# Patient Record
Sex: Female | Born: 1975
Health system: Southern US, Community
[De-identification: ages and names within clinical notes are randomized; demographics above are authoritative.]

## PROBLEM LIST (undated history)

## (undated) DIAGNOSIS — R87629 Unspecified abnormal cytological findings in specimens from vagina: Secondary | ICD-10-CM

## (undated) HISTORY — PX: TUBAL LIGATION: SHX77

## (undated) HISTORY — DX: Unspecified abnormal cytological findings in specimens from vagina: R87.629

---

## 2001-03-07 ENCOUNTER — Other Ambulatory Visit: Admission: RE | Admit: 2001-03-07 | Discharge: 2001-03-07 | Payer: Self-pay | Admitting: Dermatology

## 2001-11-19 ENCOUNTER — Other Ambulatory Visit: Admission: RE | Admit: 2001-11-19 | Discharge: 2001-11-19 | Payer: Self-pay | Admitting: Obstetrics and Gynecology

## 2002-06-09 ENCOUNTER — Inpatient Hospital Stay (HOSPITAL_COMMUNITY): Admission: RE | Admit: 2002-06-09 | Discharge: 2002-06-11 | Payer: Self-pay | Admitting: Obstetrics and Gynecology

## 2009-03-21 ENCOUNTER — Emergency Department (HOSPITAL_COMMUNITY): Admission: EM | Admit: 2009-03-21 | Discharge: 2009-03-21 | Payer: Self-pay | Admitting: Emergency Medicine

## 2011-02-17 NOTE — Op Note (Signed)
NAME:  Carla Sims, Carla Sims                         ACCOUNT NO.:  [id number]   MEDICAL RECORD NO.:  [id number]                   PATIENT TYPE:  AMB   LOCATION:  DAY                                  FACILITY:  [facility]   PHYSICIAN:  Tilda Burrow, M.D.              DATE OF BIRTH:  Jun 05, 1976   DATE OF PROCEDURE:  06/09/2002  DATE OF DISCHARGE:                                 OPERATIVE REPORT   PREOPERATIVE DIAGNOSES:  1. Pregnancy, 39 weeks.  2. Repeat cesarean section after trial of labor.  3. Elective sterilization.   POSTOPERATIVE DIAGNOSES:  1. Pregnancy, 39 weeks.  2. Repeat cesarean section after trial of labor.  3. Elective sterilization.   PROCEDURE:  Repeat low transverse cervical cesarean section.   SURGEON:  Tilda Burrow, M.D.   ASSISTANTMarlinda Mike, RN.   ANESTHESIA:  Spinal, Wahler, CRNA.   COMPLICATIONS:  None.   FINDINGS:  Healthy female infant, Apgars 8/9, weight _____________.   INDICATIONS FOR PROCEDURE:  A 35 year old female gravida 2, para 1, with  prior cesarean section for cephalopelvic disproportion, attempt at vaginal  delivery.  She was scheduled for June 09, 2002, for cesarean section and  began to contract and be mildly uncomfortable on the day of the scheduled  surgery, June 09, 2002.   DESCRIPTION OF PROCEDURE:  The patient was taken to the operating room,  prepped and draped for lower abdominal surgery.  A Pfannenstiel-type  incision performed with excision of old cicatrix.  The peritoneal cavity was  entered through the midline in standard Pfannenstiel technique and bladder  flap developed on the very thin lower uterine segment which was opened  transversely, extended laterally using index finger traction.  The membranes  ruptured with Allis clamp and then revealing clear amniotic fluid, then the  fetal vertex delivered easily using fundal pressure.  The infant was  delivered, nuchal cord x1 noted, and the cord clamped, and the infant  passed  to waiting physician, Debbora Dus, D.O.  Placental blood samples were  obtained and then placenta delivered intact, Tomasa Blase presentation, with  minimal blood loss.  The uterus was closed using single layer running,  locking closure of 0 chromic.  There was bleeding from the right uterine  artery with placement of the second suture.  This was treated by encircling  the right uterine vessels both above and below the incision, which was  ultimately good hemostasis.  Collateral circulation was excellent and result  was excellent.   PROCEDURE #2:  Tubal ligation.  The tubal ligation was then performed, removing a mid segment knuckle of  each tube after double ligation around the base of the incarcerated knuckle  of tube.  Specimens were identified with a stitch in the right tube and sent  for histology.   Bladder flap was inspected, firmed as hemostatic, 2-0 chromic continuous  running closure of the bladder flap performed followed by  irrigation of the  abdomen, sponge counts, closure of the peritoneum with 2-0 chromic followed  by 0 Vicryl closure of the fascia.  The subcutaneous tissues were inspected,  irrigated, confirmed as hemostatic, and closed with interrupted 2-0 plain  suture x3 sutures then staple closure of the skin completed the procedure.  Estimated blood loss 350 cc.                                                Tilda Burrow, M.D.    JVF/MEDQ  D:  06/09/2002  T:  06/09/2002  Job:  [id number]

## 2011-02-17 NOTE — Discharge Summary (Signed)
   NAME:  Carla Sims, Carla Sims                         ACCOUNT NO.:  [id number]   MEDICAL RECORD NO.:  [id number]                   PATIENT TYPE:  INP   LOCATION:  LDR5                                 FACILITY:  [facility]   PHYSICIAN:  Tilda Burrow, M.D.              DATE OF BIRTH:  1976-06-18   DATE OF ADMISSION:  06/09/2002  DATE OF DISCHARGE:  06/11/2002                                 DISCHARGE SUMMARY   ADMITTING DIAGNOSIS:  Pregnancy [redacted] weeks gestation, repeat cesarean section,  not for trial of labor, desire for elective permanent sterilization.   DISCHARGE DIAGNOSIS:  Pregnancy [redacted] weeks gestation, repeat cesarean section,  not for trial of labor, desire for elective permanent sterilization,  delivered.   PROCEDURE:  Repeat cesarean section, bilateral partial salpingectomy  performed on June 09, 2002.   DISCHARGE MEDICATIONS:  Tylox 1-2 q.4h. p.r.n. pain.   FOLLOW UP:  Follow up in one week for staple removal.   HOSPITAL SUMMARY:  This 35 year old Hispanic female gravida 1, para 0 was  admitted for repeat cesarean and tubal ligation, as described in the  admitting history.   PAST MEDICAL HISTORY:  Benign.   PAST SURGICAL HISTORY:  Cesarean section in 1999, 7 pound 10 ounce infant,  large for the patient's body habitus.   ALLERGIES:  None known.   PHYSICAL EXAMINATION:  VITAL SIGNS:  Height 4 feet 11 inches, weight 122  pounds.  PELVIC:  Fundal height 35 cm.   HOSPITAL COURSE:  The patient underwent repeat cesarean section on June 09, 2002 at 7:30.  The patient was contracting actively with the cesarean  section, having begun contracting approximately 4 a.m.  Postoperatively, the  patient had a low-grade temperature to 100.6 on postoperative day #1 with  hemoglobin 9, hematocrit 29 on postoperative day #1.  The second day, the  patient had a strong desire to go home, though the abdomen was  somewhat soft, slightly full, and mildly gaseous distention present.   The  patient was counseled over signs and symptoms of infection or complication.  Proceeded to go home and did well.  Followup was in one week at our office  for reevaluation.                                                 Tilda Burrow, M.D.    JVF/MEDQ  D:  07/20/2002  T:  07/21/2002  Job:  [id number]

## 2011-02-17 NOTE — H&P (Signed)
Carla Sims, VIEN NO.:  [id number]   MEDICAL RECORD NO.:  [id number]                  PATIENT TYPE:   LOCATION:                                       FACILITY:   PHYSICIAN:  Tilda Burrow, M.D.              DATE OF BIRTH:   DATE OF ADMISSION:  06/09/2002  DATE OF DISCHARGE:                                HISTORY & PHYSICAL   ADMISSION DIAGNOSES:  1. Pregnancy at 39 weeks' gestation.  2. Repeat cesarean section.  3. Not for trial of labor.  4. Desire for elective permanent sterilization.   HISTORY OF PRESENT ILLNESS:  This 35 year old Hispanic female, gravida 2,  para 1, AB 0, last menstrual period questionably 11/20, placing menstrual  EDC 05/28/02, with ultrasound-assigned EDC of 06/15/02 based on both seven-  week ultrasound and confirmed with a 20-week ultrasound suggesting EDC of  06/10/02, is admitted at 39+ weeks' gestation for repeat cesarean section and  tubal ligation.  The patient has confirmed her desire for permanent  sterilization and has signed consents in our office.  She understands that  the failure rate is 1 in 100.  I have been very careful in my explanations  today to ensure that her comprehension of permanent sterilization is clear  and complete, and she acknowledges request for permanent sterilization.  She  is therefore scheduled not only for cesarean section but tubal ligation on  06/09/02.   PAST MEDICAL HISTORY:  Negative.   PAST SURGICAL HISTORY:  Cesarean section in 1999 for a 7 pound 10 ounce  infant, large for her body habitus.   ALLERGIES:  None known.   SOCIAL HISTORY:  Married, husband, one child.  Works at [employer]  Habits:  Negative for cigarettes, alcohol, or recreational drugs.   PHYSICAL EXAMINATION:  GENERAL:  A healthy-appearing Hispanic female, alert  and oriented x3.  An alert, oriented, communicative Hispanic female who  speaks good [place].  VITAL SIGNS:  Height 4 feet 11 inches, weight  122, blood pressure 120/70,  pulse 80s.  CARDIOVASCULAR:  Unremarkable.  ABDOMEN:  35 cm fundal height, estimated fetal weight 6 pounds.  PELVIC:  Cervix not checked at this time.  It was normal at last check.    PRENATAL LABORATORY DATA:  Blood type O positive.  Urine drug screen  negative.  Hemoglobin 12, hematocrit 36.  Hepatitis, HIV, GC, Chlamydia, RPR  all negative.  Glucose tolerance test 49 mg%.   PLAN:  Repeat cesarean section with tubal ligation on 06/09/02.                                                Tilda Burrow, M.D.    JVF/MEDQ  D:  06/04/2002  T:  06/04/2002  Job:  [id number]  cc:   Francoise Schaumann. Halm, D.O.

## 2011-05-15 ENCOUNTER — Other Ambulatory Visit: Payer: Self-pay | Admitting: Family Medicine

## 2011-05-18 ENCOUNTER — Ambulatory Visit (HOSPITAL_COMMUNITY)
Admission: RE | Admit: 2011-05-18 | Discharge: 2011-05-18 | Disposition: A | Discharge status: Home / Self Care | Payer: 59 | Source: Ambulatory Visit | Attending: Family Medicine | Admitting: Family Medicine

## 2011-05-18 DIAGNOSIS — R109 Unspecified abdominal pain: Secondary | ICD-10-CM | POA: Insufficient documentation

## 2011-05-18 DIAGNOSIS — K8020 Calculus of gallbladder without cholecystitis without obstruction: Secondary | ICD-10-CM | POA: Insufficient documentation

## 2011-05-23 NOTE — H&P (Signed)
  NTS SOAP Note  Vital Signs:  Vitals as of: 05/23/2011: Systolic 104: Diastolic 67: Heart Rate 76: Temp 97.48F: Height 68ft 7.5in: Weight 109Lbs 0 Ounces: Pain Level 0: BMI 25  BMI : 24.88 kg/m2  Subjective: This 8 Years 29 Months old Female presents for of ABDOMINAL PAIN : ,Has been having right upper quadrant abdominal pain over the past few weeks. She has had nausea and vomiting. She does have fatty food intolerance. No fever, chills, or jaundice have been noted.Dr. Lubertha South, her primary doctor, did an ultrasound of the gallbladder which revealed a significant cholelithiasis with a common bile duct at 6 cm. No choledocholithiasis was seen.  Review of Symptoms:  Constitutional: unremarkable  Head: unremarkable  Eyes:unremarkable  Nose/Mouth/Throat:unremarkable  Cardiovascular:unremarkable  Respiratory: unremarkable  Gastrointestin abdominal pain,nausea,vomiting Genitourinary: unremarkable  Musculoskeletal: unremarkable  Skin: unremarkable  Hematolgic/Lymphatic: unremarkable  Allergic/Immunologic:unremarkable     Past Medical History:Reviewed  Past Medical History  Surgical History: c-sections, BTL Medical Problems: none Allergies: nkda Medications: protonix   Social History: Reviewed   Social History  Preferred Language: English ([place]) Race: American [place] or [place] Native Ethnicity: Not Hispanic / Latino Age: 55 Years 7 Months Marital Status: M Alcohol: No Recreational drug(s): No   Smoking Status: Never smoker reviewed on 05/23/2011  Family History: Reviewed  Family History  Is there a family history [id number]   Objective Information:  General: Well appearing, well nourished in no distress.  Skin:no rash or prominent lesions  Head: Atraumatic; no masses; no abnormalities  Eyes: conjunctiva clear, EOM intact, PERRL  Neck: Supple without lymphadenopathy.  Heart: RRR, no murmur or gallop. Normal S1, S2. No S3, S4.  Lungs:CTA  bilaterally, no wheezes, rhonchi, rales. Breathing unlabored.  Abdomen: Soft,ND, no HSM, no masses. Tender in right upper quadrant to palpation. No rigidity noted.  Extremities: unremarkable   LFT noted to be elevated, Direct bili .4  Assessment: cholecystitis, cholelithiasis, ?choledocholithiasis  Diagnosis & Procedure: DiagnosisCode: 574.00, ProcedureCode: 19147,   Orders: preop orders sent    Plan:Scheduled for laparoscopic cholecystectomy with cholangiograms on 05/26/11.     Patient Education: Alternative treatments to surgery were discussed with patient (and family).Risks and benefits of procedure were fully explained to the patient (and family) who gave informed consent. Patient/family questions were addressed.  Follow-up: Pending Surgery

## 2011-05-24 ENCOUNTER — Encounter (HOSPITAL_COMMUNITY)
Admission: RE | Admit: 2011-05-24 | Discharge: 2011-05-24 | Disposition: A | Discharge status: Home / Self Care | Payer: 59 | Source: Ambulatory Visit | Attending: General Surgery | Admitting: General Surgery

## 2011-05-24 ENCOUNTER — Encounter (HOSPITAL_COMMUNITY): Payer: Self-pay

## 2011-05-24 LAB — HEPATIC FUNCTION PANEL
AST: 46 U/L — ABNORMAL HIGH (ref 0–37)
Bilirubin, Direct: 0.1 mg/dL (ref 0.0–0.3)
Indirect Bilirubin: 0.3 mg/dL (ref 0.3–0.9)
Total Bilirubin: 0.4 mg/dL (ref 0.3–1.2)

## 2011-05-24 LAB — BASIC METABOLIC PANEL
BUN: 11 mg/dL (ref 6–23)
CO2: 27 mEq/L (ref 19–32)
Chloride: 101 mEq/L (ref 96–112)
Creatinine, Ser: 0.51 mg/dL (ref 0.50–1.10)
GFR calc Af Amer: >60 mL/min (ref >60)
Glucose, Bld: 88 mg/dL (ref 70–99)
Potassium: 4.1 mEq/L (ref 3.5–5.1)

## 2011-05-24 LAB — DIFFERENTIAL
Basophils Absolute: 0 10*3/uL (ref 0.0–0.1)
Basophils Relative: 0 % (ref 0–1)
Eosinophils Absolute: 0.1 10*3/uL (ref 0.0–0.7)
Eosinophils Relative: 2 % (ref 0–5)
Monocytes Absolute: 0.6 10*3/uL (ref 0.1–1.0)
Monocytes Relative: 10 % (ref 3–12)
Neutro Abs: 2.7 10*3/uL (ref 1.7–7.7)

## 2011-05-24 LAB — CBC
HCT: 36.6 % (ref 36.0–46.0)
Hemoglobin: 12.4 g/dL (ref 12.0–15.0)
MCH: 28.1 pg (ref 26.0–34.0)
MCHC: 33.9 g/dL (ref 30.0–36.0)
MCV: 82.8 fL (ref 78.0–100.0)
RDW: 13.3 % (ref 11.5–15.5)

## 2011-05-24 LAB — SURGICAL PCR SCREEN: Staphylococcus aureus: NEGATIVE

## 2011-05-24 MED ORDER — CEFAZOLIN SODIUM 1-5 GM-% IV SOLN: 1.0000 g | INTRAVENOUS | Status: DC

## 2011-05-24 MED ORDER — ENOXAPARIN SODIUM 40 MG/0.4ML ~~LOC~~ SOLN: 40.0000 mg | Freq: Once | SUBCUTANEOUS | Status: DC

## 2011-05-24 NOTE — Patient Instructions (Addendum)
General Anesthetic, Adult A nurse specialized in giving anesthesia (anesthetist) or a doctor specialized in giving anesthesia (anesthesiologist) gave you a medicine that made you sleep while a procedure was performed. For as long as 24 hours following this procedure, you may feel:  Dizzy.   Weak.   Drowsy.  BEFORE YOUR ANESTHETIC OR SURGERY  You should be present one before your procedure or as directed by your caregiver. Check in at the admissions desk to fill out any necessary forms if you are not pre-registered. There may be consent forms or other paperwork to sign before the procedure.   There is a waiting area for your family while you are having your procedure.  LET YOUR CAREGIVERS KNOW ABOUT THE FOLLOWING  Allergies.  Medications taken including herbs, eye drops, over the counter medications, dietary supplements, and creams.   Use of steroids (by mouth or creams).   Previous problems with anesthetics or novocaine.   Use of cigarettes, alcohol, or illicit drugs.   Possibility of pregnancy, if this applies.  History of blood clots (DVTs, pulmonary embolism).   History of bleeding or blood problems.   Previous surgery.   Family history (especially anesthetic problems).   Other health problems.  1.   1.  AFTER YOUR SURGERY 1. After surgery, you will be taken to the recovery area where a nurse will monitor your progress. You will be allowed to go home when you are awake, stable, taking fluids well, and without complications.  2.  3. For the first 24 hours following an anesthetic:   Have a responsible person with you.   Do not drive a car. If you are alone, do not take public transportation.   Do not drink alcohol.   Do not take medicine that has not been prescribed by your caregiver.   Do not sign important papers or make important decisions.   You may resume normal diet and activities as directed.   Change bandages (dressings) as directed.   Only take  over-the-counter or prescription medicines for pain, discomfort, and or fever, as directed by your caregiver.  If you have questions or problems that seem related to the anesthetic, call the hospital and ask for the anesthetist or anesthesiologist on call. FINDING OUT THE RESULTS OF YOUR TEST Not all test results are available during your visit. If your test results are not back during the visit, make an appointment with your caregiver to find out the results. Do not assume everything is normal if you have not heard from your caregiver or the medical facility. It is important for you to follow up on all of your test results. SEEK IMMEDIATE MEDICAL CARE IF:  You develop a rash.   You have difficulty breathing.   You have chest pain.   You have allergic problems.  Document Released: 12/26/2007 Document Re-Released: 12/13/2009 [facility] Patient Information 2011 [place], [place] Carla Sims  05/24/2011   Your procedure is scheduled on:  05/26/2011  Report to [facility] at 09:30 AM.  Call this number if you have problems the morning of surgery: [phone]   Remember:   Do not eat food:After Midnight.  Do not drink clear liquids: After Midnight.  Take these medicines the morning of surgery with A SIP OF WATER: protonix with a sip water   Do not wear jewelry, make-up or nail polish.  Do not wear lotions, powders, or perfumes. You may wear deodorant.  Do not shave 48 hours prior to surgery.  Do not bring  valuables to the hospital.  Contacts, dentures or bridgework may not be worn into surgery.  Leave suitcase in the car. After surgery it may be brought to your room.  For patients admitted to the hospital, checkout time is 11:00 AM the day of discharge.   Patients discharged the day of surgery will not be allowed to drive home.  Name and phone number of your driver: husband  Special Instructions: CHG Shower Shower 2 days before surgery and 1 day before surgery with Hibiclens.   Please  read over the following fact sheets that you were given: Pain Booklet

## 2011-05-26 ENCOUNTER — Ambulatory Visit (HOSPITAL_COMMUNITY)
Admission: RE | Admit: 2011-05-26 | Discharge: 2011-05-26 | Disposition: A | Discharge status: Home / Self Care | Payer: 59 | Source: Ambulatory Visit | Attending: General Surgery | Admitting: General Surgery

## 2011-05-26 ENCOUNTER — Other Ambulatory Visit: Payer: Self-pay | Admitting: General Surgery

## 2011-05-26 ENCOUNTER — Encounter (HOSPITAL_COMMUNITY)
Admission: RE | Disposition: A | Discharge status: Home / Self Care | Payer: Self-pay | Source: Ambulatory Visit | Attending: General Surgery

## 2011-05-26 ENCOUNTER — Encounter (HOSPITAL_COMMUNITY): Payer: Self-pay | Admitting: Anesthesiology

## 2011-05-26 ENCOUNTER — Encounter (HOSPITAL_COMMUNITY): Payer: Self-pay | Admitting: *Deleted

## 2011-05-26 ENCOUNTER — Ambulatory Visit (HOSPITAL_COMMUNITY): Payer: 59 | Admitting: Anesthesiology

## 2011-05-26 ENCOUNTER — Ambulatory Visit (HOSPITAL_COMMUNITY): Payer: 59

## 2011-05-26 DIAGNOSIS — Z01818 Encounter for other preprocedural examination: Secondary | ICD-10-CM | POA: Insufficient documentation

## 2011-05-26 DIAGNOSIS — Z01812 Encounter for preprocedural laboratory examination: Secondary | ICD-10-CM | POA: Insufficient documentation

## 2011-05-26 DIAGNOSIS — K8010 Calculus of gallbladder with chronic cholecystitis without obstruction: Secondary | ICD-10-CM | POA: Insufficient documentation

## 2011-05-26 HISTORY — PX: CHOLECYSTECTOMY: SHX55

## 2011-05-26 SURGERY — LAPAROSCOPIC CHOLECYSTECTOMY
Anesthesia: General | Wound class: Contaminated

## 2011-05-26 MED ORDER — GLYCOPYRROLATE 0.2 MG/ML IJ SOLN
INTRAMUSCULAR | Status: AC
  Filled 2011-05-26: qty 1

## 2011-05-26 MED ORDER — LACTATED RINGERS IV SOLN
INTRAVENOUS | Status: DC
  Administered 2011-05-26: 12:00:00 via INTRAVENOUS

## 2011-05-26 MED ORDER — SUCCINYLCHOLINE CHLORIDE 20 MG/ML IJ SOLN
INTRAMUSCULAR | Status: AC
  Filled 2011-05-26: qty 1

## 2011-05-26 MED ORDER — FENTANYL CITRATE 0.05 MG/ML IJ SOLN
INTRAMUSCULAR | Status: AC
  Filled 2011-05-26: qty 2

## 2011-05-26 MED ORDER — ACETAMINOPHEN 325 MG PO TABS: 325.0000 mg | ORAL_TABLET | ORAL | Status: DC | PRN

## 2011-05-26 MED ORDER — LIDOCAINE HCL 1 % IJ SOLN
INTRAMUSCULAR | Status: DC | PRN
  Administered 2011-05-26: 10 mg via INTRADERMAL

## 2011-05-26 MED ORDER — ENOXAPARIN SODIUM 40 MG/0.4ML ~~LOC~~ SOLN
SUBCUTANEOUS | Status: AC
  Filled 2011-05-26: qty 0.4

## 2011-05-26 MED ORDER — ONDANSETRON HCL 4 MG/2ML IJ SOLN: 4.0000 mg | Freq: Once | INTRAMUSCULAR | Status: DC | PRN

## 2011-05-26 MED ORDER — FENTANYL CITRATE 0.05 MG/ML IJ SOLN
INTRAMUSCULAR | Status: DC | PRN
  Administered 2011-05-26 (×4): 50 ug via INTRAVENOUS

## 2011-05-26 MED ORDER — FENTANYL CITRATE 0.05 MG/ML IJ SOLN
INTRAMUSCULAR | Status: AC
  Filled 2011-05-26: qty 5

## 2011-05-26 MED ORDER — SODIUM CHLORIDE 0.9 % IR SOLN
Status: DC | PRN
  Administered 2011-05-26: 1000 mL

## 2011-05-26 MED ORDER — GLYCOPYRROLATE 0.2 MG/ML IJ SOLN
0.2000 mg | Freq: Once | INTRAMUSCULAR | Status: AC | PRN
  Administered 2011-05-26: 0.2 mg via INTRAVENOUS

## 2011-05-26 MED ORDER — KETOROLAC TROMETHAMINE 30 MG/ML IJ SOLN
INTRAMUSCULAR | Status: AC
  Administered 2011-05-26: 30 mg via INTRAVENOUS
  Filled 2011-05-26: qty 1

## 2011-05-26 MED ORDER — FENTANYL CITRATE 0.05 MG/ML IJ SOLN
25.0000 ug | INTRAMUSCULAR | Status: DC | PRN
  Administered 2011-05-26: 25 ug via INTRAVENOUS

## 2011-05-26 MED ORDER — CEFAZOLIN SODIUM 1-5 GM-% IV SOLN
1.0000 g | Freq: Once | INTRAVENOUS | Status: AC
  Administered 2011-05-26: 1 g via INTRAVENOUS

## 2011-05-26 MED ORDER — NEOSTIGMINE METHYLSULFATE 1 MG/ML IJ SOLN
INTRAMUSCULAR | Status: DC | PRN
  Administered 2011-05-26: 2 mg via INTRAMUSCULAR

## 2011-05-26 MED ORDER — KETOROLAC TROMETHAMINE 30 MG/ML IJ SOLN
30.0000 mg | Freq: Once | INTRAMUSCULAR | Status: AC
  Administered 2011-05-26: 30 mg via INTRAVENOUS

## 2011-05-26 MED ORDER — MIDAZOLAM HCL 2 MG/2ML IJ SOLN
1.0000 mg | INTRAMUSCULAR | Status: DC | PRN
  Administered 2011-05-26: 2 mg via INTRAVENOUS

## 2011-05-26 MED ORDER — ROCURONIUM BROMIDE 100 MG/10ML IV SOLN
INTRAVENOUS | Status: DC | PRN
  Administered 2011-05-26: 5 mg via INTRAVENOUS
  Administered 2011-05-26: 15 mg via INTRAVENOUS
  Administered 2011-05-26: 5 mg via INTRAVENOUS

## 2011-05-26 MED ORDER — MIDAZOLAM HCL 2 MG/2ML IJ SOLN
INTRAMUSCULAR | Status: AC
  Filled 2011-05-26: qty 2

## 2011-05-26 MED ORDER — PROPOFOL 10 MG/ML IV EMUL
INTRAVENOUS | Status: DC | PRN
  Administered 2011-05-26: 130 mg via INTRAVENOUS
  Administered 2011-05-26: 30 mg via INTRAVENOUS

## 2011-05-26 MED ORDER — HEMOSTATIC AGENTS (NO CHARGE) OPTIME
TOPICAL | Status: DC | PRN
  Administered 2011-05-26: 2 via TOPICAL

## 2011-05-26 MED ORDER — ONDANSETRON HCL 4 MG/2ML IJ SOLN
4.0000 mg | Freq: Once | INTRAMUSCULAR | Status: AC
  Administered 2011-05-26: 4 mg via INTRAVENOUS

## 2011-05-26 MED ORDER — FENTANYL CITRATE 0.05 MG/ML IJ SOLN
INTRAMUSCULAR | Status: AC
  Administered 2011-05-26: 25 ug via INTRAVENOUS
  Filled 2011-05-26: qty 5

## 2011-05-26 MED ORDER — OXYCODONE-ACETAMINOPHEN 7.5-325 MG PO TABS: ORAL_TABLET | ORAL | Status: DC

## 2011-05-26 MED ORDER — SUCCINYLCHOLINE CHLORIDE 20 MG/ML IJ SOLN
INTRAMUSCULAR | Status: DC | PRN
  Administered 2011-05-26: 100 mg via INTRAVENOUS

## 2011-05-26 MED ORDER — PROPOFOL 10 MG/ML IV EMUL
INTRAVENOUS | Status: AC
  Filled 2011-05-26: qty 20

## 2011-05-26 MED ORDER — BUPIVACAINE HCL (PF) 0.5 % IJ SOLN
INTRAMUSCULAR | Status: AC
  Filled 2011-05-26: qty 30

## 2011-05-26 MED ORDER — LIDOCAINE HCL (PF) 1 % IJ SOLN
INTRAMUSCULAR | Status: AC
  Filled 2011-05-26: qty 5

## 2011-05-26 MED ORDER — ROCURONIUM BROMIDE 50 MG/5ML IV SOLN
INTRAVENOUS | Status: AC
  Filled 2011-05-26: qty 1

## 2011-05-26 MED ORDER — BUPIVACAINE HCL (PF) 0.5 % IJ SOLN
INTRAMUSCULAR | Status: DC | PRN
  Administered 2011-05-26: 10 mL

## 2011-05-26 MED ORDER — CEFAZOLIN SODIUM 1-5 GM-% IV SOLN
INTRAVENOUS | Status: AC
  Filled 2011-05-26: qty 50

## 2011-05-26 MED ORDER — GLYCOPYRROLATE 0.2 MG/ML IJ SOLN
INTRAMUSCULAR | Status: DC | PRN
  Administered 2011-05-26: .4 mg via INTRAVENOUS

## 2011-05-26 MED ORDER — ONDANSETRON HCL 4 MG/2ML IJ SOLN
INTRAMUSCULAR | Status: AC
  Filled 2011-05-26: qty 2

## 2011-05-26 MED ORDER — IOHEXOL 350 MG/ML SOLN
INTRAVENOUS | Status: DC | PRN
  Administered 2011-05-26: 50 mL via INTRAVENOUS

## 2011-05-26 MED ORDER — ENOXAPARIN SODIUM 40 MG/0.4ML ~~LOC~~ SOLN
40.0000 mg | Freq: Once | SUBCUTANEOUS | Status: AC
  Administered 2011-05-26: 40 mg via SUBCUTANEOUS

## 2011-05-26 SURGICAL SUPPLY — 44 items
APPLIER CLIP ROT 10 11.4 M/L (STAPLE) ×2
APR CLP MED LRG 11.4X10 (STAPLE) ×1
BAG HAMPER (MISCELLANEOUS) ×2 IMPLANT
BAG SPEC RTRVL LRG 6X4 10 (ENDOMECHANICALS) ×1
CATH CHOLANGIOGRAM 4.5FR (CATHETERS) ×2 IMPLANT
CLIP APPLIE ROT 10 11.4 M/L (STAPLE) ×1 IMPLANT
CLOTH BEACON ORANGE TIMEOUT ST (SAFETY) ×2 IMPLANT
COVER LIGHT HANDLE STERIS (MISCELLANEOUS) ×4 IMPLANT
COVER MAYO STAND XLG (DRAPE) ×2 IMPLANT
DECANTER SPIKE VIAL GLASS SM (MISCELLANEOUS) ×2 IMPLANT
DISSECTOR BLUNT TIP ENDO 5MM (MISCELLANEOUS) IMPLANT
DRAPE C-ARM FOLDED MOBILE STRL (DRAPES) ×2 IMPLANT
DURAPREP 26ML APPLICATOR (WOUND CARE) ×2 IMPLANT
ELECT REM PT RETURN 9FT ADLT (ELECTROSURGICAL) ×2
ELECTRODE REM PT RTRN 9FT ADLT (ELECTROSURGICAL) ×1 IMPLANT
FILTER SMOKE EVAC LAPAROSHD (FILTER) ×2 IMPLANT
FORMALIN 10 PREFIL 120ML (MISCELLANEOUS) ×2 IMPLANT
GLOVE BIO SURGEON STRL SZ7.5 (GLOVE) ×2 IMPLANT
GLOVE ECLIPSE 6.5 STRL STRAW (GLOVE) ×1 IMPLANT
GLOVE ECLIPSE 7.0 STRL STRAW (GLOVE) ×1 IMPLANT
GLOVE EXAM NITRILE MD LF STRL (GLOVE) ×1 IMPLANT
GLOVE INDICATOR 7.0 STRL GRN (GLOVE) ×2 IMPLANT
GLOVE INDICATOR 7.5 STRL GRN (GLOVE) ×1 IMPLANT
GOWN BRE IMP SLV AUR XL STRL (GOWN DISPOSABLE) ×6 IMPLANT
HEMOSTAT SNOW SURGICEL 2X4 (HEMOSTASIS) ×3 IMPLANT
INST SET LAPROSCOPIC AP (KITS) ×2 IMPLANT
KIT ROOM TURNOVER APOR (KITS) ×2 IMPLANT
KIT TROCAR LAP CHOLE (TROCAR) ×2 IMPLANT
MANIFOLD NEPTUNE II (INSTRUMENTS) ×2 IMPLANT
NS IRRIG 1000ML POUR BTL (IV SOLUTION) ×2 IMPLANT
PACK LAP CHOLE LZT030E (CUSTOM PROCEDURE TRAY) ×2 IMPLANT
PAD ARMBOARD 7.5X6 YLW CONV (MISCELLANEOUS) ×2 IMPLANT
POUCH SPECIMEN RETRIEVAL 10MM (ENDOMECHANICALS) ×2 IMPLANT
SET BASIN LINEN APH (SET/KITS/TRAYS/PACK) ×2 IMPLANT
SET TUBE IRRIG SUCTION NO TIP (IRRIGATION / IRRIGATOR) IMPLANT
SPONGE GAUZE 2X2 8PLY STRL LF (GAUZE/BANDAGES/DRESSINGS) ×5 IMPLANT
STAPLER VISISTAT (STAPLE) ×2 IMPLANT
SUT VICRYL 0 UR6 27IN ABS (SUTURE) ×3 IMPLANT
SYR 20CC LL (SYRINGE) ×2 IMPLANT
SYR 30ML LL (SYRINGE) ×2 IMPLANT
TAPE CLOTH SURG 4X10 WHT LF (GAUZE/BANDAGES/DRESSINGS) ×1 IMPLANT
TOWEL OR 17X26 4PK STRL BLUE (TOWEL DISPOSABLE) ×2 IMPLANT
WARMER LAPAROSCOPE (MISCELLANEOUS) ×2 IMPLANT
YANKAUER SUCT 12FT TUBE ARGYLE (SUCTIONS) ×2 IMPLANT

## 2011-05-26 NOTE — Interval H&P Note (Signed)
History and Physical Interval Note:   05/26/2011   11:19 AM   Carla Sims  has presented today for surgery, with the diagnosis of Cholelithiasis [574.20]  The various methods of treatment have been discussed with the patient and family. After consideration of risks, benefits and other options for treatment, the patient has consented to  Procedure(s): LAPAROSCOPIC CHOLECYSTECTOMY WITH INTRAOPERATIVE CHOLANGIOGRAM as a surgical intervention .  I have reviewed the patients' chart and labs.  Questions were answered to the patient's satisfaction.     Dalia Heading  MD

## 2011-05-26 NOTE — Op Note (Signed)
Patient:  Carla Sims  DOB:  19-Jun-1976  MRN:  [id number]   Preop Diagnosis:  Cholecystitis, cholelithiasis  Postop Diagnosis:  Same  Procedure:  Laparoscopic cholecystectomy  Surgeon:  Franky Macho, M.D.  Anes:  General endotracheal  Indications:  Patient is a 35 year old Hispanic female who presents with acute cholecystitis secondary to cholelithiasis. The patient now comes for laparoscopic cholecystectomy with cholangiograms. Of note was the fact that her liver enzyme tests have normalized since they were checked in the emergency room prior to her visit to my office. The risks and benefits of the procedure including bleeding, infection, hepatobiliary injury, the possibly of an open procedure were fully explained to the patient, gave informed consent.  Procedure note:  Patient was placed in the supine position. After induction of general endotracheal anesthesia, the abdomen was prepped and draped using usual sterile technique with DuraPrep. Surgical site confirmation was performed.  An infraumbilical incision was made down to the fascia. A varies needle was introduced into the abdominal cavity and confirmation of placement was done using the saline drop test. The abdomen was then insufflated to 16 mm mercury pressure. An 11 mm trocar was introduced into the abdominal cavity under direct visualization the difficulty. The patient's placed in reverse Trendelenburg position additional 11 mm trocar was placed the epigastric region 5 mm trochars were placed the right upper quadrant and right flank regions. The liver was inspected and noted within normal limits. The gallbladder was noted to be full of stones and inflamed to a moderate degree at the infundibulum. The cystic duct was first identified. Its junction to the infundibulum was fully identified. The cystic duct was narrow and did not contain any stones. Given its size, I elected not to proceed with cholangiograms. Endoclips were placed  proximally distally on the cystic duct and cystic duct was divided. This likewise done the cystic artery. The gallbladder was then freed away from the gallbladder fossa using Bovie electrocautery. The gallbladder delivered to the epigastric trocar site. The gallbladder fossa was inspected no abnormal bleeding or bile leakage was noted. Surgicel is placed the gallbladder fossa. All fluid and air were then evacuated from the abdominal cavity prior to removal of the trochars.  All wounds were normal saline. All wounds were injected with 0.5% Sensorcaine. The infraumbilical fashion as well as epigastric fascia reapproximated using 0 Vicryl interrupted sutures. All skin incisions were closed using staples. Betadine ointment after dressings were applied.  All tape and needle counts were correct at the end of the procedure. The patient was extubated in the operating room and went back to recovery awake in stable condition.   Complications:  None  EBL:  Minimal  Specimen:  Gallbladder

## 2011-05-26 NOTE — Anesthesia Procedure Notes (Signed)
Procedure Name: Intubation Date/Time: 05/26/2011 12:28 PM Performed by: Minerva Areola Pre-anesthesia Checklist: Patient identified, Patient being monitored, Timeout performed, Emergency Drugs available and Suction available Patient Re-evaluated:Patient Re-evaluated prior to inductionOxygen Delivery Method: Circle System Utilized Preoxygenation: Pre-oxygenation with 100% oxygen Intubation Type: Rapid sequence and Circoid Pressure applied Laryngoscope Size: Miller and 2 Grade View: Grade I Tube type: Oral Tube size: 7.0 mm Number of attempts: 1 Airway Equipment and Method: stylet Placement Confirmation: ETT inserted through vocal cords under direct vision,  positive ETCO2 and breath sounds checked- equal and bilateral Secured at: 18 cm Tube secured with: Tape Dental Injury: Teeth and Oropharynx as per pre-operative assessment

## 2011-05-26 NOTE — Anesthesia Preprocedure Evaluation (Addendum)
Anesthesia Evaluation  Name, MR# and DOB Patient awake  General Assessment Comment  Reviewed: Allergy & Precautions, H&P , NPO status , Patient's Chart, lab work & pertinent test results  Airway Mallampati: I TM Distance: >3 FB Neck ROM: Full    Dental No notable dental hx.    Pulmonary    pulmonary exam normalPulmonary Exam Normal     Cardiovascular Regular Normal    Neuro/Psych Negative Neurological ROS  Negative Psych ROS  GI/Hepatic/Renal   negative Liver ROS  negative Renal ROS   GERD Medicated     Endo/Other  Negative Endocrine ROS (+)      Abdominal Normal abdominal exam  (+)   Musculoskeletal negative musculoskeletal ROS (+)   Hematology negative hematology ROS (+)   Peds  Reproductive/Obstetrics negative OB ROS    Anesthesia Other Findings            Anesthesia Physical Anesthesia Plan  ASA: I  Anesthesia Plan: General   Post-op Pain Management:    Induction: Intravenous, Rapid sequence and Cricoid pressure planned  Airway Management Planned: Oral ETT  Additional Equipment:   Intra-op Plan:   Post-operative Plan: Extubation in OR  Informed Consent: I have reviewed the patients History and Physical, chart, labs and discussed the procedure including the risks, benefits and alternatives for the proposed anesthesia with the patient or authorized representative who has indicated his/her understanding and acceptance.     Plan Discussed with: CRNA  Anesthesia Plan Comments:         Anesthesia Quick Evaluation

## 2011-05-26 NOTE — Transfer of Care (Signed)
Immediate Anesthesia Transfer of Care Note  Patient: Carla Sims  Procedure(s) Performed:  LAPAROSCOPIC CHOLECYSTECTOMY  Patient Location: PACU  Anesthesia Type: General  Level of Consciousness: awake  Airway & Oxygen Therapy: Patient Spontanous Breathing and non-rebreather face mask  Post-op Assessment: Report given to PACU RN, Post -op Vital signs reviewed and stable and Patient moving all extremities  Post vital signs: Reviewed and stable  Complications: No apparent anesthesia complications

## 2011-05-30 NOTE — Anesthesia Postprocedure Evaluation (Signed)
Anesthesia Post Note  Patient: Carla Sims  Procedure(s) Performed:  LAPAROSCOPIC CHOLECYSTECTOMY  Anesthesia type: General  Patient location: PACU  Post pain: Pain level controlled  Post assessment: Post-op Vital signs reviewed, Patient's Cardiovascular Status Stable, Respiratory Function Stable, Patent Airway and Pain level controlled  Last Vitals:  Filed Vitals:   05/26/11 1434  BP: 127/66  Pulse: 62  Temp: 97.9 F (36.6 C)  Resp: 18    Post vital signs: Reviewed and stable  Level of consciousness: awake  Complications: No apparent anesthesia complications Late entry by T.Doris Gruhn CRNA Pt.stable 05/26/11

## 2011-06-01 ENCOUNTER — Encounter (HOSPITAL_COMMUNITY): Payer: Self-pay | Admitting: General Surgery

## 2013-10-01 ENCOUNTER — Ambulatory Visit (INDEPENDENT_AMBULATORY_CARE_PROVIDER_SITE_OTHER): Payer: 59 | Admitting: Nurse Practitioner

## 2013-10-01 ENCOUNTER — Encounter: Payer: Self-pay | Admitting: Nurse Practitioner

## 2013-10-01 VITALS — BP 110/80 | Temp 98.4°F | Ht 66.0 in | Wt 104.1 lb

## 2013-10-01 DIAGNOSIS — J209 Acute bronchitis, unspecified: Secondary | ICD-10-CM

## 2013-10-01 DIAGNOSIS — J069 Acute upper respiratory infection, unspecified: Secondary | ICD-10-CM

## 2013-10-01 MED ORDER — AMOXICILLIN-POT CLAVULANATE 875-125 MG PO TABS: 1.0000 | ORAL_TABLET | Freq: Two times a day (BID) | ORAL | Status: DC

## 2013-10-01 MED ORDER — HYDROCODONE-HOMATROPINE 5-1.5 MG/5ML PO SYRP: 5.0000 mL | ORAL_SOLUTION | ORAL | Status: DC | PRN

## 2013-10-03 ENCOUNTER — Encounter: Payer: Self-pay | Admitting: Nurse Practitioner

## 2013-10-03 NOTE — Progress Notes (Signed)
Subjective:  Presents with complaints of cough and congestion for the past 5-6 days. Low-grade fever. Frequent cough worse at night. No wheezing. No headache. Scratchy throat. Occasional ear pain. Taking fluids well. Voiding normal limit. No vomiting diarrhea or abdominal pain.  Objective:   BP 110/80  Temp(Src) 98.4 F (36.9 C) (Oral)  Ht 5\' 6"  (1.676 m)  Wt 104 lb 2 oz (47.231 kg)  BMI 16.81 kg/m2 NAD. Alert, oriented. TMs clear effusion, no erythema. Pharynx injected with PND noted. Neck supple with mild soft nontender adenopathy. Lungs scattered faint expiratory crackles, no wheezing or tachypnea. Frequent bronchitic cough noted. Heart regular rate rhythm.  Assessment:Acute upper respiratory infections of unspecified site  Acute bronchitis  Plan: Meds ordered this encounter  Medications  . amoxicillin-clavulanate (AUGMENTIN) 875-125 MG per tablet    Sig: Take 1 tablet by mouth 2 (two) times daily.    Dispense:  20 tablet    Refill:  0    Order Specific Question:  Supervising Provider    Answer:  Merlyn AlbertLUKING, WILLIAM S [[id number]]  . HYDROcodone-homatropine (HYCODAN) 5-1.5 MG/5ML syrup    Sig: Take 5 mLs by mouth every 4 (four) hours as needed.    Dispense:  120 mL    Refill:  0    Order Specific Question:  Supervising Provider    Answer:  Merlyn AlbertLUKING, WILLIAM S [[id number]]   OTC meds as directed for daytime symptoms. Warning signs reviewed. Call back in 5-7 days if no improvement, sooner if worse.

## 2014-04-30 ENCOUNTER — Encounter: Payer: 59 | Admitting: Nurse Practitioner

## 2014-08-01 ENCOUNTER — Encounter: Payer: Self-pay | Admitting: *Deleted

## 2014-08-21 ENCOUNTER — Encounter: Payer: Self-pay | Admitting: Nurse Practitioner

## 2014-08-21 ENCOUNTER — Ambulatory Visit (INDEPENDENT_AMBULATORY_CARE_PROVIDER_SITE_OTHER): Payer: BC Managed Care – PPO | Admitting: Nurse Practitioner

## 2014-08-21 VITALS — BP 112/70 | Ht <= 58 in | Wt 107.4 lb

## 2014-08-21 DIAGNOSIS — Z0000 Encounter for general adult medical examination without abnormal findings: Secondary | ICD-10-CM

## 2014-08-21 DIAGNOSIS — Z1231 Encounter for screening mammogram for malignant neoplasm of breast: Secondary | ICD-10-CM

## 2014-08-21 DIAGNOSIS — Z124 Encounter for screening for malignant neoplasm of cervix: Secondary | ICD-10-CM

## 2014-08-24 LAB — PAP IG W/ RFLX HPV ASCU

## 2014-08-26 ENCOUNTER — Encounter: Payer: Self-pay | Admitting: Nurse Practitioner

## 2014-08-26 NOTE — Progress Notes (Signed)
   Subjective:    Patient ID: Carla Sims, female    DOB: 07/02/1976, 38 y.o.   MRN: [id number]  HPI presents for her wellness exam. Active job. Healthy diet. Regular menses, heavy first day; 3 days total. Married, same sexual partner. Regular vision and dental exams. No family history of breast cancer. Labs are done at work; normal according to patient; results unavailable during office visit.    Review of Systems  Constitutional: Negative for fever, activity change, appetite change and fatigue.  HENT: Negative for dental problem, ear pain, sinus pressure and sore throat.   Respiratory: Negative for cough, chest tightness, shortness of breath and wheezing.   Cardiovascular: Negative for chest pain.  Gastrointestinal: Negative for nausea, vomiting, abdominal pain, diarrhea, constipation and abdominal distention.  Genitourinary: Negative for dysuria, urgency, frequency, vaginal discharge, enuresis, difficulty urinating, genital sores, menstrual problem and pelvic pain.       Objective:   Physical Exam  Constitutional: She is oriented to person, place, and time. She appears well-developed. No distress.  HENT:  Right Ear: External ear normal.  Left Ear: External ear normal.  Mouth/Throat: Oropharynx is clear and moist.  Neck: Normal range of motion. Neck supple. No tracheal deviation present. No thyromegaly present.  Cardiovascular: Normal rate, regular rhythm and normal heart sounds.  Exam reveals no gallop.   No murmur heard. Pulmonary/Chest: Effort normal and breath sounds normal.  Abdominal: Soft. She exhibits no distension. There is no tenderness.  Genitourinary: Vagina normal and uterus normal. No vaginal discharge found.  External GU: no rashes or lesions; vagina: no discharge; cervix nl in appearance; no CMT; bimanual exam: no tenderness or obvious masses  Musculoskeletal: She exhibits no edema.  Lymphadenopathy:    She has no cervical adenopathy.  Neurological: She is alert  and oriented to person, place, and time.  Skin: Skin is warm and dry. No rash noted.  Psychiatric: She has a normal mood and affect. Her behavior is normal.  Vitals reviewed. Breast exam: minimal fine nodularity; no dominant masses; axillae no adenopathy        Assessment & Plan:  Routine general medical examination at a health care facility  Screening for cervical cancer - Plan: Pap IG w/ reflex to HPV when ASC-U  Visit for screening mammogram - Plan: MM DIGITAL SCREENING BILATERAL  Recommend regular exercise and daily vitamin D/calcium supplementation. Also advised patient to bring labs to next visit. Return in about 1 year (around 08/22/2015).

## 2014-08-31 ENCOUNTER — Ambulatory Visit (HOSPITAL_COMMUNITY)
Admission: RE | Admit: 2014-08-31 | Discharge: 2014-08-31 | Disposition: A | Discharge status: Home / Self Care | Payer: BC Managed Care – PPO | Source: Ambulatory Visit | Attending: Nurse Practitioner | Admitting: Nurse Practitioner

## 2014-08-31 DIAGNOSIS — Z1231 Encounter for screening mammogram for malignant neoplasm of breast: Secondary | ICD-10-CM | POA: Diagnosis present

## 2015-08-30 ENCOUNTER — Encounter: Payer: Self-pay | Admitting: Nurse Practitioner

## 2016-01-06 ENCOUNTER — Ambulatory Visit (INDEPENDENT_AMBULATORY_CARE_PROVIDER_SITE_OTHER): Payer: BLUE CROSS/BLUE SHIELD | Admitting: Nurse Practitioner

## 2016-01-06 ENCOUNTER — Encounter: Payer: Self-pay | Admitting: Nurse Practitioner

## 2016-01-06 VITALS — BP 100/68 | Ht <= 58 in | Wt 107.0 lb

## 2016-01-06 DIAGNOSIS — B3731 Acute candidiasis of vulva and vagina: Secondary | ICD-10-CM

## 2016-01-06 DIAGNOSIS — Z0000 Encounter for general adult medical examination without abnormal findings: Secondary | ICD-10-CM | POA: Diagnosis not present

## 2016-01-06 DIAGNOSIS — Z01419 Encounter for gynecological examination (general) (routine) without abnormal findings: Secondary | ICD-10-CM

## 2016-01-06 DIAGNOSIS — Z1231 Encounter for screening mammogram for malignant neoplasm of breast: Secondary | ICD-10-CM

## 2016-01-06 DIAGNOSIS — B373 Candidiasis of vulva and vagina: Secondary | ICD-10-CM | POA: Diagnosis not present

## 2016-01-06 MED ORDER — TERCONAZOLE 0.4 % VA CREA: 1.0000 | TOPICAL_CREAM | Freq: Every day | VAGINAL | Status: DC

## 2016-01-06 MED ORDER — HYDROCORTISONE 2.5 % EX CREA: TOPICAL_CREAM | CUTANEOUS | Status: DC

## 2016-01-06 NOTE — Progress Notes (Signed)
   Subjective:    Patient ID: Carla Sims, female    DOB: 12/15/1975, 40 y.o.   MRN: [id number]  HPI presents for her wellness exam. Has routine lab work done through her job, states all of them have been normal. No copy available during visit. Married, same sexual partner. Regular menstrual cycle, normal flow. Regular vision and dental exams. Has had some slight itching especially after intercourse. No fever pelvic pain or discharge. Temporary relief with Monistat.    Review of Systems  Constitutional: Negative for activity change, appetite change and fatigue.  HENT: Negative for dental problem, ear pain, sinus pressure and sore throat.   Respiratory: Negative for cough, chest tightness, shortness of breath and wheezing.   Cardiovascular: Negative for chest pain.  Gastrointestinal: Negative for nausea, vomiting, abdominal pain, diarrhea, constipation and abdominal distention.  Genitourinary: Negative for dysuria, urgency, frequency, vaginal discharge, enuresis, difficulty urinating, genital sores, menstrual problem and pelvic pain.       Objective:   Physical Exam  Constitutional: She is oriented to person, place, and time. She appears well-developed. No distress.  HENT:  Right Ear: External ear normal.  Left Ear: External ear normal.  Mouth/Throat: Oropharynx is clear and moist.  Neck: Normal range of motion. Neck supple. No tracheal deviation present. No thyromegaly present.  Cardiovascular: Normal rate, regular rhythm and normal heart sounds.  Exam reveals no gallop.   No murmur heard. Pulmonary/Chest: Effort normal and breath sounds normal.  Abdominal: Soft. She exhibits no distension. There is no tenderness.  Genitourinary: Vagina normal and uterus normal. No vaginal discharge found.  External GU: no rashes or lesions. Vagina: No erythema, small amount of thick white discharge noted. No CMT. Cervix normal limit in appearance. Bimanual exam no tenderness or obvious masses.    Musculoskeletal: She exhibits no edema.  Lymphadenopathy:    She has no cervical adenopathy.  Neurological: She is alert and oriented to person, place, and time.  Skin: Skin is warm and dry. No rash noted.  Psychiatric: She has a normal mood and affect. Her behavior is normal.  Vitals reviewed. Breast exam: Very dense tissue with multiple fine nodularity, no dominant masses. Axilla no adenopathy.       Assessment & Plan:  Well woman exam  Vagina, candidiasis  Encounter for screening mammogram for breast cancer - Plan: MM DIGITAL SCREENING BILATERAL  Meds ordered this encounter  Medications  . terconazole (TERAZOL 7) 0.4 % vaginal cream    Sig: Place 1 applicator vaginally at bedtime. x7d    Dispense:  45 g    Refill:  0    Order Specific Question:  Supervising Provider    Answer:  Carla Sims, WILLIAM S [[id number]]  . hydrocortisone 2.5 % cream    Sig: Apply a small amount to facial rash BID prn up to 2 weeks at a time    Dispense:  30 g    Refill:  0    Order Specific Question:  Supervising Provider    Answer:  Carla Sims, WILLIAM S [[id number]]   Acidophilus as directed for yeast prevention. Call back if symptoms persist. Also patient mentions a faint rash around the lower part of her face that occurs off and on. Has been using her husband's Elocon cream. Switch to low strength hydrocortisone 2.5%. Call back if persists. Encouraged healthy diet with vitamin D and calcium. Return in about 1 year (around 01/05/2017) for physical.

## 2016-01-06 NOTE — Patient Instructions (Signed)
Take Acidophilous as directed to prevent yeast infections (this is a natural supplement that you can buy at the store)

## 2016-01-17 ENCOUNTER — Ambulatory Visit (HOSPITAL_COMMUNITY)
Admission: RE | Admit: 2016-01-17 | Discharge: 2016-01-17 | Disposition: A | Discharge status: Home / Self Care | Payer: BLUE CROSS/BLUE SHIELD | Source: Ambulatory Visit | Attending: Nurse Practitioner | Admitting: Nurse Practitioner

## 2016-01-17 ENCOUNTER — Other Ambulatory Visit: Payer: Self-pay | Admitting: Nurse Practitioner

## 2016-01-17 DIAGNOSIS — Z1231 Encounter for screening mammogram for malignant neoplasm of breast: Secondary | ICD-10-CM

## 2016-02-21 ENCOUNTER — Ambulatory Visit (INDEPENDENT_AMBULATORY_CARE_PROVIDER_SITE_OTHER): Payer: BLUE CROSS/BLUE SHIELD | Admitting: Family Medicine

## 2016-02-21 ENCOUNTER — Encounter: Payer: Self-pay | Admitting: Family Medicine

## 2016-02-21 VITALS — BP 106/72 | Temp 98.9°F | Ht <= 58 in | Wt 101.5 lb

## 2016-02-21 DIAGNOSIS — A084 Viral intestinal infection, unspecified: Secondary | ICD-10-CM

## 2016-02-21 MED ORDER — ONDANSETRON 4 MG PO TBDP: 4.0000 mg | ORAL_TABLET | Freq: Three times a day (TID) | ORAL | Status: DC | PRN

## 2016-02-21 MED ORDER — DIPHENOXYLATE-ATROPINE 2.5-0.025 MG PO TABS: ORAL_TABLET | ORAL | Status: DC

## 2016-02-21 NOTE — Patient Instructions (Signed)

## 2016-02-21 NOTE — Progress Notes (Signed)
   Subjective:    Patient ID: Carla Sims, female    DOB: 11/04/1975, 40 y.o.   MRN: [id number]  Abdominal Pain This is a new problem. The current episode started in the past 7 days. The pain is located in the generalized abdominal region. Associated symptoms include diarrhea, headaches, myalgias and vomiting. Treatments tried: Pepto, Advil.  last Monday had epigastric abd pain , fairly severe, vom also,  Feeling nauseted some diarrhea    peptobismol helped a little   No fever  Patient has concern of lower back pain when standing.  Patient also experiencing substantial achiness in her legs and joints.  patient had a relative with fairly severe viral gastroenteritis a week before she got sick  Review of Systems  Gastrointestinal: Positive for vomiting, abdominal pain and diarrhea.  Musculoskeletal: Positive for myalgias.  Neurological: Positive for headaches.       Objective:   Physical Exam   alert vitals stable moderate malaise hydration decent lungs clear heart rare rhythm abdomen hyperactive bowel sounds no discrete tenderness      Assessment & Plan:   impression viral gastritis with accompanying fatigue and myalgias/arthralgias. Discussed. Patient feels completely unable to work this week plan work excuse. Lomotil when necessary for diarrhea Zofran. For nausea warning signs discussed WSL

## 2016-02-22 DIAGNOSIS — Z029 Encounter for administrative examinations, unspecified: Secondary | ICD-10-CM

## 2016-02-29 ENCOUNTER — Encounter: Payer: Self-pay | Admitting: Family Medicine

## 2017-06-20 ENCOUNTER — Other Ambulatory Visit: Payer: Self-pay | Admitting: Nurse Practitioner

## 2017-06-20 DIAGNOSIS — Z1231 Encounter for screening mammogram for malignant neoplasm of breast: Secondary | ICD-10-CM

## 2017-06-25 ENCOUNTER — Ambulatory Visit (HOSPITAL_COMMUNITY)
Admission: RE | Admit: 2017-06-25 | Discharge: 2017-06-25 | Disposition: A | Discharge status: Home / Self Care | Payer: BLUE CROSS/BLUE SHIELD | Source: Ambulatory Visit | Attending: Nurse Practitioner | Admitting: Nurse Practitioner

## 2017-06-25 DIAGNOSIS — Z1231 Encounter for screening mammogram for malignant neoplasm of breast: Secondary | ICD-10-CM | POA: Insufficient documentation

## 2017-06-27 ENCOUNTER — Other Ambulatory Visit: Payer: Self-pay | Admitting: Nurse Practitioner

## 2017-06-27 DIAGNOSIS — R928 Other abnormal and inconclusive findings on diagnostic imaging of breast: Secondary | ICD-10-CM

## 2017-08-21 ENCOUNTER — Ambulatory Visit (HOSPITAL_COMMUNITY)
Admission: RE | Admit: 2017-08-21 | Discharge: 2017-08-21 | Disposition: A | Discharge status: Home / Self Care | Payer: BLUE CROSS/BLUE SHIELD | Source: Ambulatory Visit | Attending: Nurse Practitioner | Admitting: Nurse Practitioner

## 2017-08-21 DIAGNOSIS — N6489 Other specified disorders of breast: Secondary | ICD-10-CM | POA: Insufficient documentation

## 2017-08-21 DIAGNOSIS — R928 Other abnormal and inconclusive findings on diagnostic imaging of breast: Secondary | ICD-10-CM | POA: Diagnosis not present

## 2017-08-21 DIAGNOSIS — R922 Inconclusive mammogram: Secondary | ICD-10-CM | POA: Diagnosis not present

## 2018-04-02 ENCOUNTER — Other Ambulatory Visit: Payer: Self-pay | Admitting: Family Medicine

## 2018-04-02 ENCOUNTER — Telehealth: Payer: Self-pay | Admitting: Family Medicine

## 2018-04-02 DIAGNOSIS — Z0000 Encounter for general adult medical examination without abnormal findings: Secondary | ICD-10-CM

## 2018-04-02 DIAGNOSIS — Z79899 Other long term (current) drug therapy: Secondary | ICD-10-CM

## 2018-04-02 NOTE — Telephone Encounter (Signed)
Labs ordered. Left message to return call 

## 2018-04-02 NOTE — Telephone Encounter (Signed)
Lip liv m7 cbc 

## 2018-04-02 NOTE — Telephone Encounter (Signed)
Patient needing lab done before appointment on 7/30

## 2018-04-05 NOTE — Telephone Encounter (Signed)
Left message to return call 

## 2018-04-09 NOTE — Telephone Encounter (Signed)
Left message to return call 

## 2018-04-10 NOTE — Telephone Encounter (Signed)
Left message to return call 

## 2018-04-12 NOTE — Telephone Encounter (Signed)
Left message to return call 

## 2018-04-17 NOTE — Telephone Encounter (Signed)
Left message on voicemail letting pt know we put the labs in she requested.

## 2018-04-30 ENCOUNTER — Encounter: Payer: BLUE CROSS/BLUE SHIELD | Admitting: Nurse Practitioner

## 2018-10-09 ENCOUNTER — Other Ambulatory Visit (HOSPITAL_COMMUNITY): Payer: Self-pay | Admitting: Family Medicine

## 2018-10-09 DIAGNOSIS — Z1231 Encounter for screening mammogram for malignant neoplasm of breast: Secondary | ICD-10-CM

## 2018-11-04 ENCOUNTER — Ambulatory Visit (HOSPITAL_COMMUNITY)
Admission: RE | Admit: 2018-11-04 | Discharge: 2018-11-04 | Disposition: A | Discharge status: Home / Self Care | Payer: BLUE CROSS/BLUE SHIELD | Source: Ambulatory Visit | Attending: Family Medicine | Admitting: Family Medicine

## 2018-11-04 DIAGNOSIS — Z1231 Encounter for screening mammogram for malignant neoplasm of breast: Secondary | ICD-10-CM | POA: Insufficient documentation

## 2019-05-16 ENCOUNTER — Other Ambulatory Visit: Payer: Self-pay

## 2019-05-16 ENCOUNTER — Ambulatory Visit (INDEPENDENT_AMBULATORY_CARE_PROVIDER_SITE_OTHER): Payer: BC Managed Care – PPO | Admitting: Nurse Practitioner

## 2019-05-16 ENCOUNTER — Encounter: Payer: Self-pay | Admitting: Nurse Practitioner

## 2019-05-16 VITALS — BP 114/70 | Temp 98.0°F | Ht <= 58 in | Wt 116.2 lb

## 2019-05-16 DIAGNOSIS — R5383 Other fatigue: Secondary | ICD-10-CM | POA: Diagnosis not present

## 2019-05-16 DIAGNOSIS — R631 Polydipsia: Secondary | ICD-10-CM | POA: Diagnosis not present

## 2019-05-16 DIAGNOSIS — N912 Amenorrhea, unspecified: Secondary | ICD-10-CM

## 2019-05-16 LAB — POCT URINE PREGNANCY: Preg Test, Ur: NEGATIVE

## 2019-05-16 NOTE — Progress Notes (Signed)
   Subjective:    Patient ID: Carla Sims, female    DOB: [birth year], 43 y.o.   MRN: [id number]  HPI No menstrual cycle in 2 months. LMP 03/10/2019 and it was regular. Has had some weight gain in the past 2 months.  Pt complains of polydipsia, polyphagia, and polyuria in the past few months. Mother has type 2 diabetes mellitus. Pt does not regularly exercise. Pt complains of dry skin and some hair loss in the past few months. No issues with bowels or sleeping patterns. Patient is married with one sexual partner. Not taking anything for contraceptive.  Results for orders placed or performed in visit on 05/16/19  POCT urine pregnancy  Result Value Ref Range   Preg Test, Ur Negative Negative     Review of Systems  Constitutional: Positive for appetite change and fatigue.  Respiratory: Negative for cough, chest tightness and shortness of breath.   Cardiovascular: Negative for chest pain.  Gastrointestinal: Negative for abdominal pain, constipation, diarrhea, nausea and vomiting.  Endocrine: Positive for polydipsia, polyphagia and polyuria.  Genitourinary: Positive for menstrual problem. Negative for difficulty urinating and dysuria.       Amennorhea        Objective:   Physical Exam Constitutional:      Appearance: Normal appearance.  Neck:     Thyroid: No thyroid mass or thyromegaly.  Cardiovascular:     Rate and Rhythm: Normal rate and regular rhythm.     Pulses: Normal pulses.     Heart sounds: Normal heart sounds.  Pulmonary:     Effort: Pulmonary effort is normal.     Breath sounds: Normal breath sounds.  Skin:    General: Skin is dry.  Neurological:     Mental Status: She is alert.           Assessment & Plan:  1. Amenorrhea - POCT urine pregnancy - TSH - FSH - Hemoglobin A1c - Lipid panel - CBC with Differential/Platelet - Comprehensive metabolic panel - Estradiol  2. Polydipsia - TSH - FSH - Hemoglobin A1c - Lipid panel - CBC with  Differential/Platelet - Comprehensive metabolic panel - Estradiol  3. Fatigue, unspecified type - TSH - FSH - Hemoglobin A1c - Lipid panel - CBC with Differential/Platelet - Comprehensive metabolic panel - Estradiol  -Discussed with patient pre-menopause, which she may be experiencing. Educated the patient about medications, such as vaginal ring, Implanon, and oral contraceptives to help stimulate menstruation. Awaiting labs. Needs annual wellness exam. Discussed risks associated with hormone use particularly estrogen. Patient desires pills if needed depending on labs.  Return for physical.

## 2019-05-26 ENCOUNTER — Telehealth: Payer: Self-pay | Admitting: Family Medicine

## 2019-05-26 NOTE — Telephone Encounter (Signed)
Patient husband dropped off FMLA to be completed due to husband and daughter being tested for  Covid in your box. I fill in what I could.

## 2019-05-27 DIAGNOSIS — Z029 Encounter for administrative examinations, unspecified: Secondary | ICD-10-CM

## 2019-06-04 NOTE — Telephone Encounter (Signed)
Please advise. Thank you

## 2019-06-04 NOTE — Telephone Encounter (Signed)
Patient daughter called this morning checking on FMLA they are needing form by 9/4.

## 2019-06-04 NOTE — Telephone Encounter (Signed)
done

## 2019-10-25 IMAGING — MG DIGITAL SCREENING BILATERAL MAMMOGRAM WITH TOMO AND CAD
8 series · 9 of 24 positions shown · non-contrast
Comparison: Previous exam(s).

CLINICAL DATA: Screening.

EXAM:
DIGITAL SCREENING BILATERAL MAMMOGRAM WITH TOMO AND CAD

[L CC synth-2D]
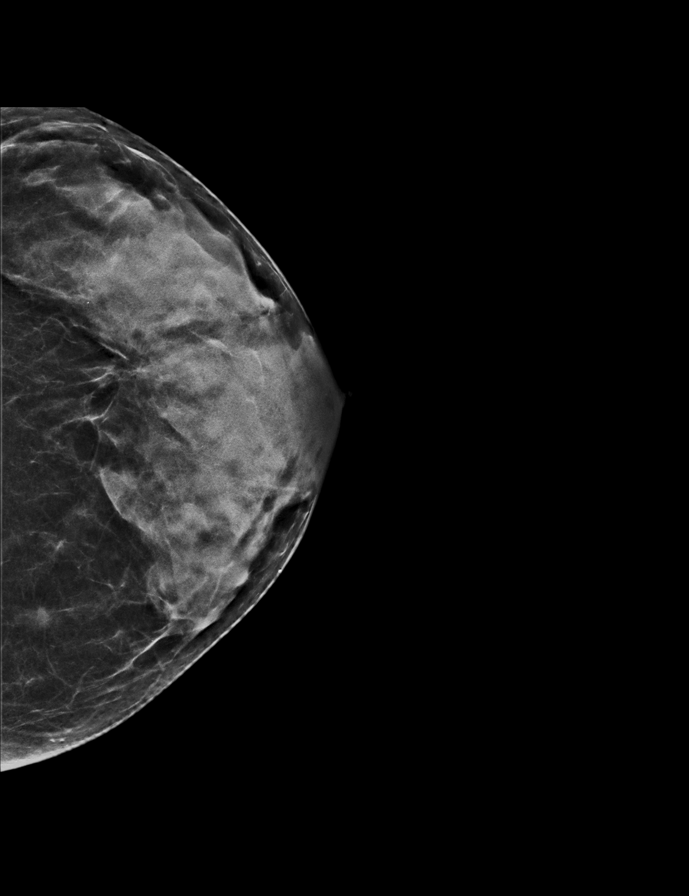

[R CC synth-2D]
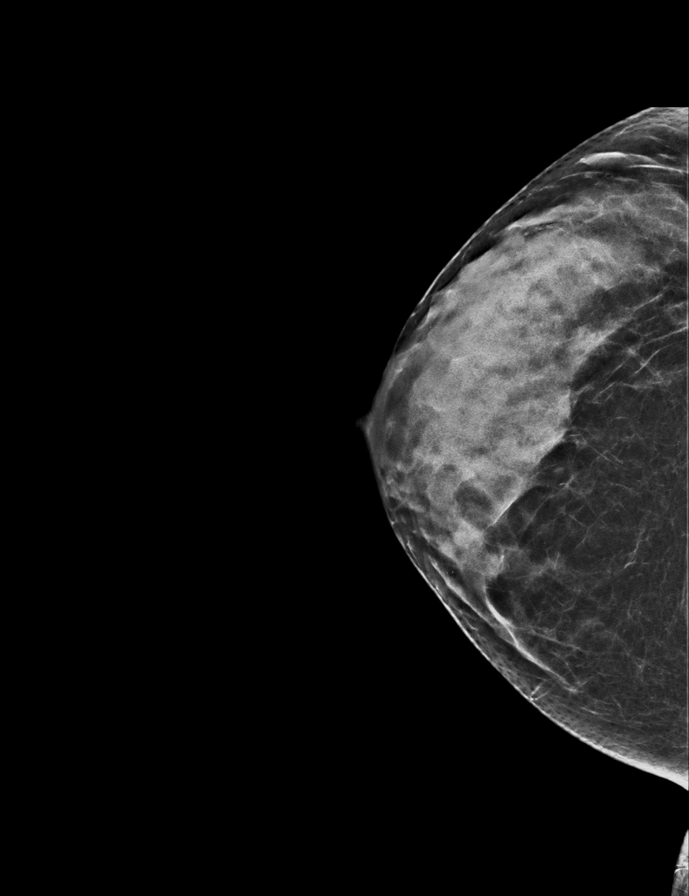

[L MLO synth-2D]
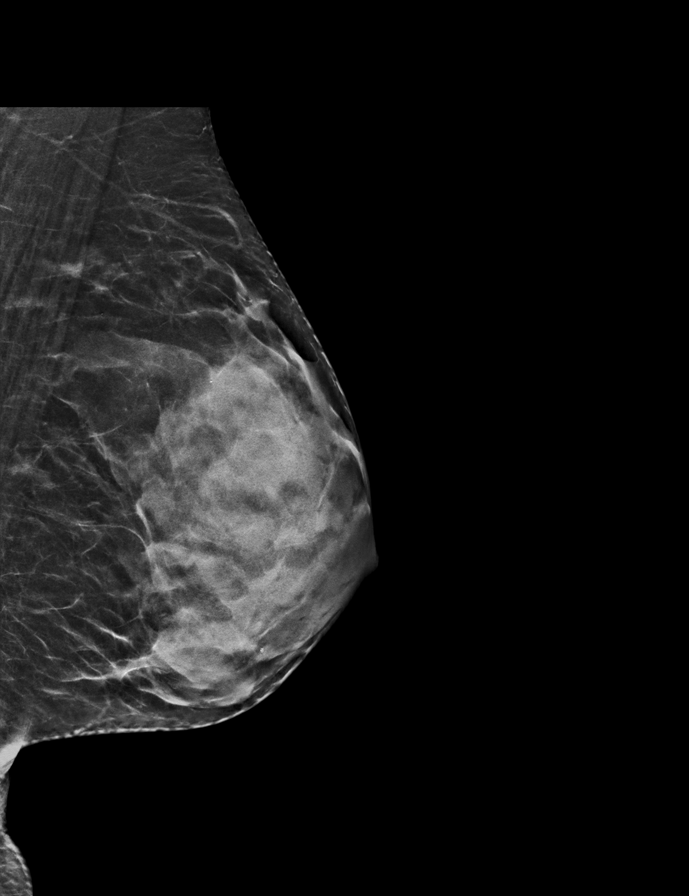

[R MLO synth-2D]
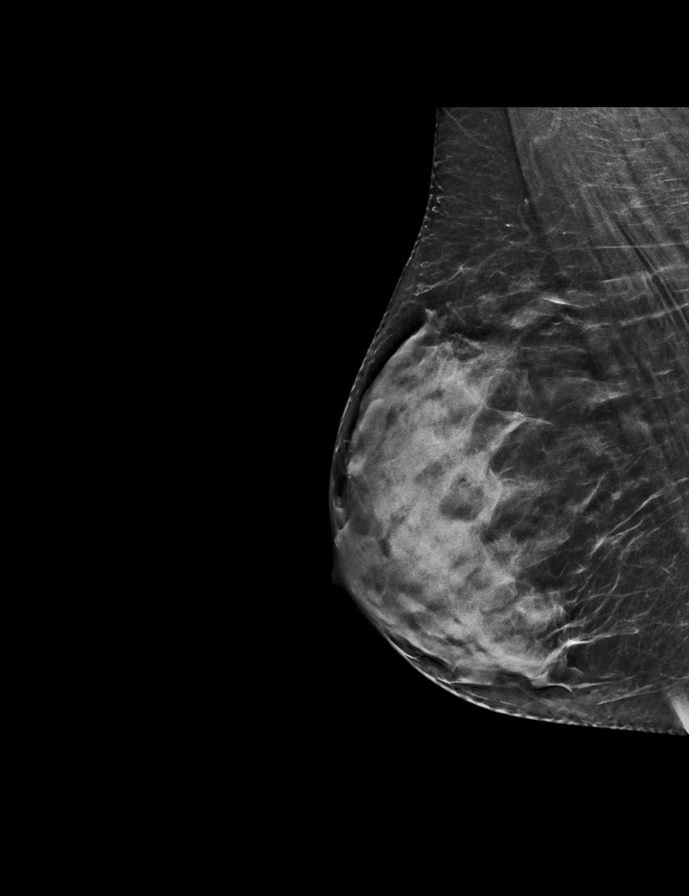

[R CC tomo · 2 of 53 frames shown]
[frame 18/53]
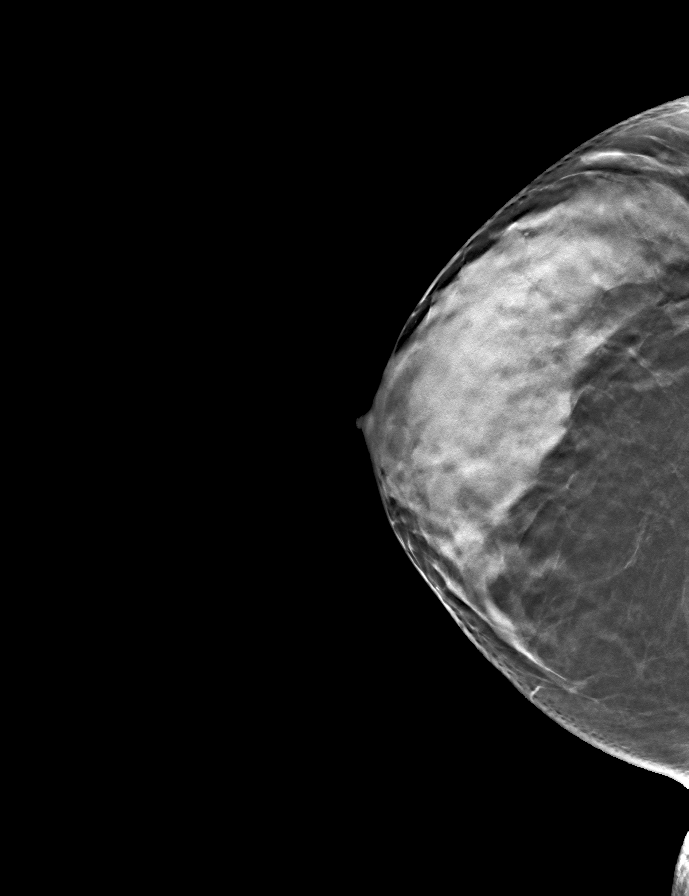
[frame 27/53]
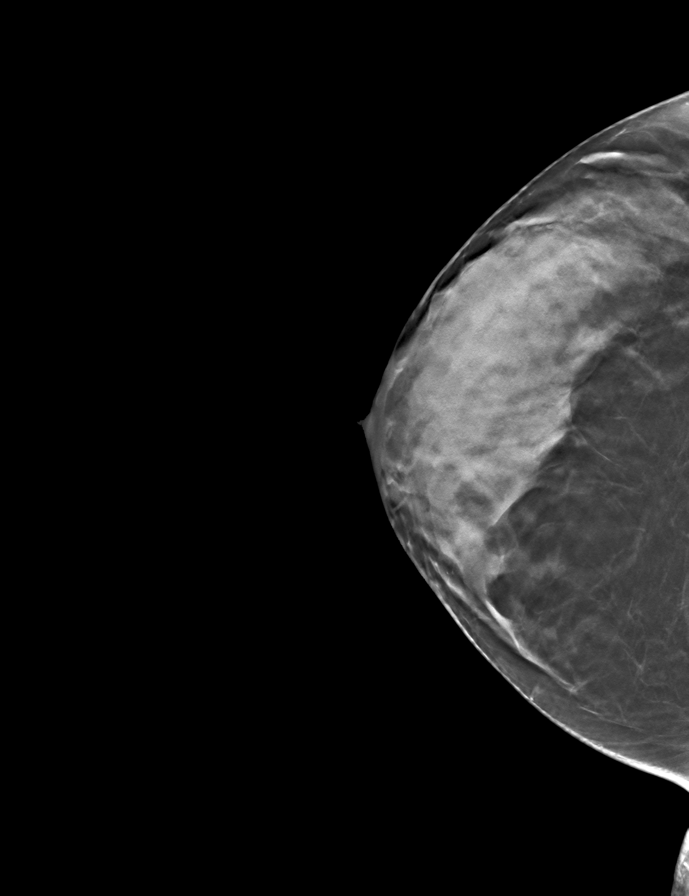

[L CC tomo · tomo slice 25/50.0]
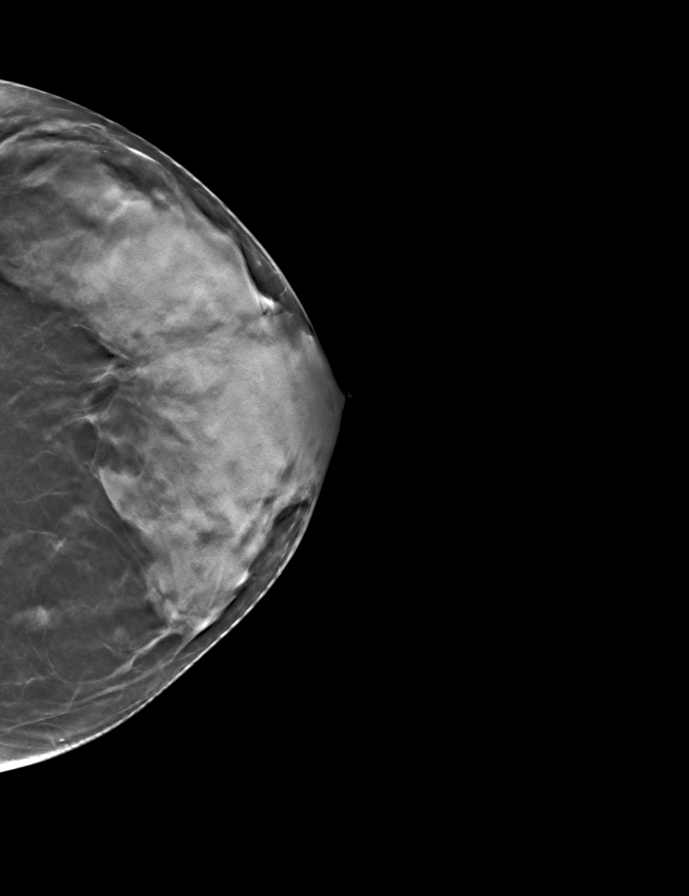

[R MLO tomo · tomo slice 30/59.0]
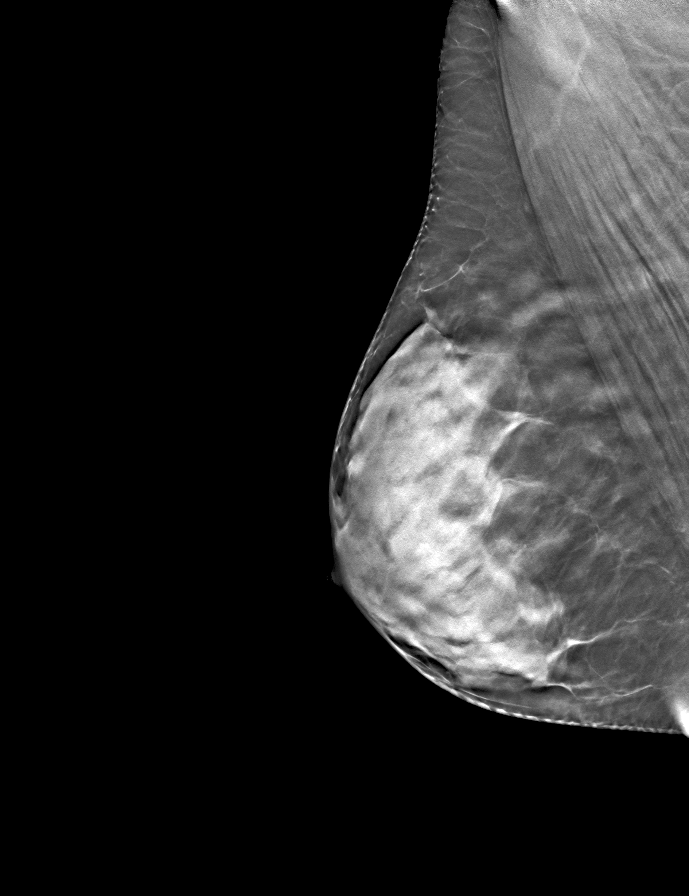

[L MLO tomo · tomo slice 27/54.0]
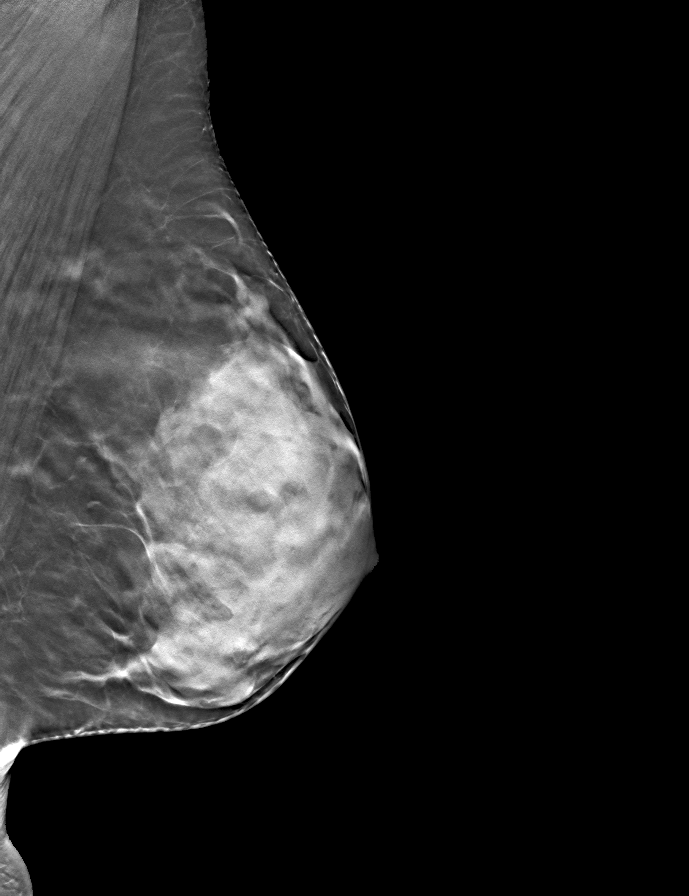

[9 of 24 positions shown; findings below may reference images not displayed]

ACR Breast Density Category d: The breast tissue is extremely dense,
which lowers the sensitivity of mammography
FINDINGS: There are no findings suspicious for malignancy. Images were
processed with CAD.
IMPRESSION: No mammographic evidence of malignancy. A result letter of this
screening mammogram will be mailed directly to the patient.

RECOMMENDATION:
Screening mammogram in one year. (Code:WO-0-ZI0)

BI-RADS CATEGORY  1: Negative.

## 2019-11-03 ENCOUNTER — Encounter: Payer: Self-pay | Admitting: Family Medicine

## 2020-11-05 ENCOUNTER — Encounter: Payer: BC Managed Care – PPO | Admitting: Nurse Practitioner

## 2020-12-03 ENCOUNTER — Other Ambulatory Visit: Payer: Self-pay

## 2020-12-03 ENCOUNTER — Ambulatory Visit (INDEPENDENT_AMBULATORY_CARE_PROVIDER_SITE_OTHER): Payer: BC Managed Care – PPO | Admitting: Nurse Practitioner

## 2020-12-03 VITALS — BP 122/82 | HR 91 | Temp 97.0°F | Ht <= 58 in | Wt 117.4 lb

## 2020-12-03 DIAGNOSIS — Z1151 Encounter for screening for human papillomavirus (HPV): Secondary | ICD-10-CM

## 2020-12-03 DIAGNOSIS — Z01419 Encounter for gynecological examination (general) (routine) without abnormal findings: Secondary | ICD-10-CM

## 2020-12-03 DIAGNOSIS — Z13220 Encounter for screening for lipoid disorders: Secondary | ICD-10-CM | POA: Diagnosis not present

## 2020-12-03 DIAGNOSIS — R5383 Other fatigue: Secondary | ICD-10-CM | POA: Diagnosis not present

## 2020-12-03 DIAGNOSIS — Z131 Encounter for screening for diabetes mellitus: Secondary | ICD-10-CM | POA: Diagnosis not present

## 2020-12-03 DIAGNOSIS — M7711 Lateral epicondylitis, right elbow: Secondary | ICD-10-CM

## 2020-12-03 DIAGNOSIS — Z0000 Encounter for general adult medical examination without abnormal findings: Secondary | ICD-10-CM

## 2020-12-03 DIAGNOSIS — Z124 Encounter for screening for malignant neoplasm of cervix: Secondary | ICD-10-CM | POA: Diagnosis not present

## 2020-12-03 NOTE — Progress Notes (Signed)
   Subjective:    Patient ID: Carla Sims, female    DOB: 10-21-1975, 45 y.o.   MRN: [id number]  HPI  The patient comes in today for a wellness visit.    A review of their health history was completed.  A review of medications was also completed.  Any needed refills; no  Eating habits: trying to eat good  Falls/  MVA accidents in past few months: no  Regular exercise: no  Specialist pt sees on regular basis: no  Preventative health issues were discussed.   Additional concerns: right elbow pain   Review of Systems     Objective:   Physical Exam        Assessment & Plan:

## 2020-12-03 NOTE — Patient Instructions (Signed)
Codo de tenista Tennis Elbow  El codo de tenista es la irritacin e hinchazn (inflamacin) en la zona externa del antebrazo, cerca del codo. La hinchazn afecta los tejidos que conectan el msculo al hueso (tendones). El codo de tenista puede presentarse al practicar cualquier deporte o realizar una tarea que exige el uso excesivo del codo. La causa del codo de tenista es la repeticin del mismo movimiento una y otra vez. Cules son las causas? Esta afeccin a menudo se produce por practicar deportes o realizar tareas en los que tiene que mover el antebrazo de la misma manera. A veces, la afeccin puede deberse a una lesin repentina. Qu incrementa el riesgo? Tiene ms probabilidades de tener codo de tenista si juega al tenis o practica otro deporte con raqueta. Tambin tiene un mayor riesgo si usa frecuentemente las manos para trabajar. Esto puede comprender lo siguiente:  Usuarios de computadoras.  Trabajadores de la construccin.  Personas que trabajan en una fbrica.  Msicos.  Cocineros.  Cajeros. Cules son los signos o sntomas?  Dolor y sensibilidad a la palpacin en el antebrazo y la parte externa del codo. Puede sentir dolor todo el tiempo o solo cuando usa el brazo.  Sensacin de ardor. Esta empieza en el codo y se extiende por el brazo.  Agarre dbil en la mano. Cmo se trata? Descansar el brazo y aplicar hielo es el primer tratamiento. El mdico tambin puede recomendarle:  Medicamentos para reducir el dolor y la hinchazn.  Una correa para codo.  Fisioterapia. Este tratamiento puede incluir masajes o ejercicios, o ambos.  Un dispositivo ortopdico para el codo. Si estos no ayudan a que sus sntomas mejoren, el mdico puede recomendarle una ciruga. Siga estas instrucciones en su casa: Si tiene un dispositivo ortopdico o una correa:  Use el dispositivo ortopdico o la correa como se lo haya indicado el mdico. Quteselos solamente como se lo haya indicado  el mdico.  Controle la piel alrededor del dispositivo ortopdico o la correa todos los das. Comunquese con su mdico si ve algn problema.  Afljeselos si los dedos de la mano: ? Hormiguean. ? Se adormecen. ? Se tornan fros y de color azul.  Mantenga la correa o el dispositivo ortopdico limpio.  Si el dispositivo ortopdico o la correa no son impermeables: ? No deje que se mojen. ? Cbralos con un envoltorio hermtico cuando tome un bao de inmersin o una ducha. Control del dolor, la rigidez y la hinchazn  Aplique hielo sobre la zona lesionada si se lo indican. Para hacer esto: ? Si tiene un dispositivo ortopdico o una correa desmontable, quteselo como se lo haya indicado el mdico. ? Ponga el hielo en una bolsa plstica. ? Coloque una toalla entre la piel y la bolsa. ? Aplique el hielo durante 20minutos, 2 o 3veces por da. ? Retire el hielo si la piel se le pone de color rojo brillante. Esto es muy importante. Si no puede sentir dolor, calor o fro, tiene un mayor riesgo de que se dae la zona.  Mueva los dedos con frecuencia.   Actividad  Descanse el codo y la mueca. Evite las actividades que pueden causar problemas en el codo, como se lo haya indicado el mdico.  Haga los ejercicios como se lo haya indicado el mdico.  Si levanta un objeto, hgalo con la palma de la mano hacia arriba. Estilo de vida  Si el codo de tenista se debe a los deportes, revise el equipo y asegrese de lo   siguiente: ? Que lo est utilizando de forma correcta. ? Que es apto para usted.  Si el codo de tenista se debe a su trabajo o al uso de una computadora, tmese descansos con frecuencia para estirar el brazo. Hable con su gerente sobre cmo puede hacer que su afeccin mejore en el trabajo. Instrucciones generales  Tome los medicamentos de venta libre y los recetados solamente como se lo haya indicado el mdico.  No fume ni consuma ningn producto que contenga nicotina o tabaco. Si  necesita ayuda para dejar de consumir estos productos, consulte al mdico.  Cumpla con todas las visitas de seguimiento. Cmo se evita?  Antes y despus de estar activo: ? Precaliente y elongue adecuadamente antes de hacer actividad fsica. ? Reljese y elongue despus de hacer actividad fsica. ? Dele al cuerpo tiempo para descansar entre las actividades.  Mientras est activo: ? Asegrese de usar el equipo que sea apto para usted. ? Si juega al tenis, dele potencia a su golpe con la parte inferior del cuerpo. Evite usar nicamente el brazo.  Mantenga un estado fsico adecuado. Esto puede comprender lo siguiente: ? Fuerza. ? Flexibilidad. ? Resistencia.  Haga ejercicios para fortalecer los msculos del antebrazo. Comunquese con un mdico si:  El dolor no mejora con el tratamiento.  El dolor empeora.  Tiene debilidad en el antebrazo, la mano o los dedos de la mano.  No puede sentir el antebrazo, la mano o los dedos de la mano. Solicite ayuda de inmediato si:  El dolor es muy intenso.  No puede mover la mueca. Resumen  El codo de tenista es la irritacin e hinchazn (inflamacin) en la zona externa del antebrazo, cerca del codo.  Su causa es la repeticin del mismo movimiento una y otra vez.  Descanse el codo y la mueca. Evite las actividades de acuerdo con lo que le indique su mdico.  Si se lo indican, aplique hielo sobre la zona lesionada durante 20 minutos, de 2 a 3veces por da. Esta informacin no tiene como fin reemplazar el consejo del mdico. Asegrese de hacerle al mdico cualquier pregunta que tenga. Document Revised: 05/03/2020 Document Reviewed: 05/03/2020 Elsevier Patient Education  2021 [employer] Inc.  

## 2020-12-03 NOTE — Progress Notes (Signed)
Subjective:    Patient ID: Carla Sims, female    DOB: 10-10-1975, 45 y.o.   MRN: [id number]  HPI  Patient comes in today for a wellness visit. Has irregular menstruation, occurs every other month. Not currently on any medications except multivitamins. Has an appointment for mammogram in June. Has the same sexual partner. No concerns for birth control. Had a tubal ligation in the past. Due for eye exam and gets regular dental exams.   Covid vaccinated in 03/2020, no covid booster shot.  A review of their health history was completed.  A review of medications was also completed.  Any needed refills; no  Eating habits: trying to eat good  Falls/  MVA accidents in past few months: no  Regular exercise: no  Specialist pt sees on regular basis: no  Preventative health issues were discussed.   Additional concerns: right elbow pain  Right elbow pain started 2 to 3 weeks ago. No specific history of injury. Fairly constant but worsens with movement. Takes tylenol and uses icy hot for pain. Pain has not gone away. Severity of 8 at worst. Pain score of 5 today.    Review of Systems  Constitutional: Negative for activity change, appetite change, fever and unexpected weight change.  Respiratory: Negative for cough, chest tightness, shortness of breath and wheezing.   Cardiovascular: Negative for chest pain and leg swelling.  Gastrointestinal: Negative for abdominal pain, blood in stool, constipation, diarrhea, nausea and vomiting.       No family history of colon cancer  Genitourinary: Negative for difficulty urinating, dysuria, enuresis, frequency, genital sores, pelvic pain, urgency and vaginal discharge.   Depression screen PHQ 2/9 12/03/2020  Decreased Interest 0  Down, Depressed, Hopeless 0  PHQ - 2 Score 0          Objective:   Physical Exam Vitals and nursing note reviewed. Exam conducted with a chaperone present.  Constitutional:      Appearance: Normal  appearance. She is normal weight.  Neck:     Comments: Thyroid soft, smooth, symmetrical, and non-tender; no mass or goiter noted.  Cardiovascular:     Rate and Rhythm: Normal rate and regular rhythm.     Pulses: Normal pulses.     Heart sounds: Normal heart sounds. No murmur heard.   Pulmonary:     Effort: Pulmonary effort is normal. No respiratory distress.     Breath sounds: Normal breath sounds.  Chest:  Breasts:     Right: No swelling, inverted nipple, mass, skin change, tenderness, axillary adenopathy or supraclavicular adenopathy.     Left: No swelling, inverted nipple, mass, skin change, tenderness, axillary adenopathy or supraclavicular adenopathy.      Comments: Dense breast tissue noted bilaterally Abdominal:     General: Abdomen is flat. There is no distension.     Palpations: Abdomen is soft. There is no mass.     Tenderness: There is no abdominal tenderness.  Genitourinary:    General: Normal vulva.     Vagina: No vaginal discharge.     Rectum: Normal.     Comments: External genitalia: no rash or lesions noted.   Vagina is pink, no lesions, no mass, no abnormal discharge;  Cervix normal in appearance. Bimanual exam: no tenderness or obvious masses.  Musculoskeletal:        General: Swelling and tenderness present. Normal range of motion.     Right lower leg: No edema.     Left lower leg: No edema.  Comments: Tenderness and mild edema at the right lateral epicondyle. Slight tenderness with ROM. No erythema or warmth.   Lymphadenopathy:     Upper Body:     Right upper body: No supraclavicular, axillary or pectoral adenopathy.     Left upper body: No supraclavicular, axillary or pectoral adenopathy.  Neurological:     Mental Status: She is alert and oriented to person, place, and time.  Psychiatric:        Mood and Affect: Mood normal.        Behavior: Behavior normal.        Thought Content: Thought content normal.        Judgment: Judgment normal.     Today's Vitals   12/03/20 1321  BP: 122/82  Pulse: 91  Temp: (!) 97 F (36.1 C)  TempSrc: Oral  SpO2: 99%  Weight: 117 lb 6.4 oz (53.3 kg)  Height: 4' 8.25" (1.429 m)   Body mass index is 26.09 kg/m.       Assessment & Plan:   Problem List Items Addressed This Visit   None   Visit Diagnoses    Well woman exam with routine gynecological exam    -  Primary   Relevant Orders   IGP, Aptima HPV   Fatigue, unspecified type       Relevant Orders   CBC with Differential/Platelet   Screening, lipid       Relevant Orders   Lipid panel   Encounter for screening examination for impaired glucose regulation and diabetes mellitus       Relevant Orders   Comprehensive metabolic panel   Lateral epicondylitis of right elbow       Screening for human papillomavirus (HPV)       Relevant Orders   IGP, Aptima HPV   Screening for cervical cancer       Relevant Orders   IGP, Aptima HPV        Plan/ education  Educated on the importance of exercise and healthy diet low in fat.   Education handout for lateral epicondylitis. Recommend arm band and OTC analgesics. Also topical analgesics as directed.    Defers colonoscopy for now.  Labs pending.   Return in about 1 year (around 12/03/2021) for physical.

## 2020-12-04 ENCOUNTER — Encounter: Payer: Self-pay | Admitting: Nurse Practitioner

## 2020-12-13 LAB — IGP, APTIMA HPV: HPV Aptima: POSITIVE — AB

## 2021-01-12 ENCOUNTER — Encounter: Payer: Self-pay | Admitting: Nurse Practitioner

## 2021-01-12 ENCOUNTER — Telehealth: Payer: Self-pay

## 2021-01-12 NOTE — Telephone Encounter (Signed)
-----   Message from Marlowe Shores, LPN sent at 3/41/9622 10:07 AM EDT ----- Please see Carolyns message. Thank you.

## 2021-01-12 NOTE — Telephone Encounter (Addendum)
**  Return receipt from certified letter received back on 01/18/21.  Signed by [place] CiFuentes.   Letter mailed 01/12/21 both regular mail and certified mail to patient to let her know her test results and to contact our office to discuss results and refer to GYN.

## 2021-02-07 ENCOUNTER — Telehealth: Payer: Self-pay

## 2021-02-07 NOTE — Telephone Encounter (Signed)
[place] at [facility] sent a message that patient's daughter had contacted them to schedule an appt for her mom.  They received our letter about needing to see GYN and I guess they are just going to make their own appointment.  Enid Derry needs a referral placed to GYN so they can see it.  (Don't put any referrals under Dr. Lawana Chambers name because [facility] still has her as an external provider.  Any referrals for her will have to be put in as "referred to department [facility]" so they can see it).

## 2021-02-09 ENCOUNTER — Other Ambulatory Visit: Payer: Self-pay | Admitting: Nurse Practitioner

## 2021-02-09 DIAGNOSIS — R87810 Cervical high risk human papillomavirus (HPV) DNA test positive: Secondary | ICD-10-CM

## 2021-02-09 DIAGNOSIS — R87619 Unspecified abnormal cytological findings in specimens from cervix uteri: Secondary | ICD-10-CM | POA: Insufficient documentation

## 2021-02-09 DIAGNOSIS — R87612 Low grade squamous intraepithelial lesion on cytologic smear of cervix (LGSIL): Secondary | ICD-10-CM

## 2021-03-22 ENCOUNTER — Other Ambulatory Visit: Payer: Self-pay

## 2021-03-22 ENCOUNTER — Encounter: Payer: Self-pay | Admitting: Women's Health

## 2021-03-22 ENCOUNTER — Ambulatory Visit (INDEPENDENT_AMBULATORY_CARE_PROVIDER_SITE_OTHER): Payer: BC Managed Care – PPO | Admitting: Women's Health

## 2021-03-22 ENCOUNTER — Other Ambulatory Visit (HOSPITAL_COMMUNITY)
Admission: RE | Admit: 2021-03-22 | Discharge: 2021-03-22 | Disposition: A | Discharge status: Home / Self Care | Payer: BC Managed Care – PPO | Source: Ambulatory Visit | Attending: Obstetrics & Gynecology | Admitting: Obstetrics & Gynecology

## 2021-03-22 VITALS — BP 133/71 | HR 79 | Ht <= 58 in | Wt 118.5 lb

## 2021-03-22 DIAGNOSIS — Z3202 Encounter for pregnancy test, result negative: Secondary | ICD-10-CM

## 2021-03-22 DIAGNOSIS — R87612 Low grade squamous intraepithelial lesion on cytologic smear of cervix (LGSIL): Secondary | ICD-10-CM | POA: Insufficient documentation

## 2021-03-22 DIAGNOSIS — N870 Mild cervical dysplasia: Secondary | ICD-10-CM | POA: Diagnosis not present

## 2021-03-22 DIAGNOSIS — R87810 Cervical high risk human papillomavirus (HPV) DNA test positive: Secondary | ICD-10-CM | POA: Diagnosis not present

## 2021-03-22 LAB — POCT URINE PREGNANCY: Preg Test, Ur: NEGATIVE

## 2021-03-22 NOTE — Progress Notes (Signed)
   COLPOSCOPY PROCEDURE NOTE Patient name: Carla Sims MRN [id number]  Date of birth: 31-Jul-1976 Subjective Findings:   Carla Sims is a 45 y.o. G48P2002 Hispanic female being seen today for a colposcopy. Indication: Abnormal pap on 12/03/20: LSIL w/ HRHPV positive: type not specified Pathology report says EPCA (epithelial cell abnormality)/CHVIRB (reviewed this w/ Dr. Charlotta Newton who also does not know what this is) Prior cytology:  Date Result Procedure  2015 NILM w/ HRHPV not done None              Patient's last menstrual period was 02/21/2021 (approximate). Contraception: tubal ligation. Menopausal: no. Hysterectomy: no.   Smoker: no. Immunocompromised: no.   The risks and benefits were explained and informed consent was obtained, and written copy is in chart. Pertinent History Reviewed:   Reviewed past medical,surgical, social, obstetrical and family history.  Reviewed problem list, medications and allergies. Objective Findings & Procedure:   Vitals:   03/22/21 1624  BP: 133/71  Pulse: 79  Weight: 118 lb 8 oz (53.8 kg)  Height: 4\' 8"  (1.422 m)  Body mass index is 26.57 kg/m.  Results for orders placed or performed in visit on 03/22/21 (from the past 24 hour(s))  POCT urine pregnancy   Collection Time: 03/22/21  4:21 PM  Result Value Ref Range   Preg Test, Ur Negative Negative     Time out was performed.  Speculum placed in the vagina, cervix fully visualized. SCJ: not fully visualized, stenotic very tiny os Cervix swabbed x 3 with acetic acid.  Acetowhitening present: No Cervix: no visible lesions, no mosaicism, no punctation, and no abnormal vasculature. Endocervical curettage performed and Cervical biopsies taken at 12 & 6 o'clock. Vagina: vaginal colposcopy not performed Vulva: vulvar colposcopy not performed  Specimens: 3 (2 bx, 1 ECC)  Complications: none  Chaperone: 03/24/21    Spanish interpreter present  Colposcopic Impression & Plan:   Colposcopy w/o  visible lesions/ACW changes, bx & ECC pending Plan: Post biopsy instructions given, Will notify patient of results when back, and Will base plan of care on pathology results and ASCCP guidelines  No follow-ups on file.  Malachy Mood CNM, [facility] 03/22/2021 4:36 PM

## 2021-03-22 NOTE — Patient Instructions (Signed)
[email or web address]  Colposcopa, cuidados posteriores Colposcopy, Care After Esta hoja le brinda informacin sobre cmo cuidarse despus del procedimiento. Su mdico tambin podr darle instrucciones ms especficas. Comunquese con elmdico si tiene problemas o preguntas. Qu puedo esperar despus del procedimiento? Si le realizaron una colposcopa sin biopsia, puede esperar sentirse bien inmediatamente despus del procedimiento. Sin embargo, es posible que tenga algo de manchado de sangre durante algunos das. Puede retomar sus actividadeshabituales. Si se le realiz una colposcopa con biopsia, despus del procedimiento es frecuente que presente lo siguiente: Sensibilidad y dolor leve. Esto puede durar algunos das. Desvanecimiento. Sangrado leve de la vagina o secrecin de color oscuro y granulada. Esto puede durar algunos das. La secrecin puede deberse a un lquido (solucin) que se us durante el procedimiento. Durante este tiempo deber usar un apsito sanitario. Manchas de sangre durante al menos 48 horas despus del procedimiento. Siga estas instrucciones en su casa: Medicamentos Use los medicamentos de venta libre y los recetados solamente como se lo haya indicado el mdico. Hable con el mdico acerca de qu tipo de analgsico de venta libre y recetado puede volver a tomar. Es especialmente importante que hable con el mdico si toma anticoagulantes. Actividad Limite la actividad fsica durante el primer da despus del procedimiento como se lo haya indicado el mdico. Evite usar productos de ducha vaginal, usar tampones o tener relaciones sexuales durante al menos 3 das despus del procedimiento o durante el tiempo que le hayan indicado. Retome sus actividades normales segn lo indicado el mdico. Pregntele al mdico qu actividades son seguras para usted. Instrucciones generales  Beber suficiente lquido como para mantener la orina de color amarillo  plido. Pregunte al mdico si puede tomar baos de inmersin, nadar o usar el jacuzzi. Puede ducharse. Si usa un mtodo anticonceptivo (anticoncepcin), contine utilizndolo. Concurra a todas las visitas de seguimiento como se lo haya indicado el mdico. Esto es importante.  Comunquese con un mdico si: Tiene una erupcin cutnea. Solicite ayuda de inmediato si: Sangra mucho por la vagina o le salen cogulos de sangre. Esto incluye usar ms de un apsito sanitario cada hora durante 2 horas seguidas. Tiene fiebre o escalofros. Tiene secrecin vaginal que es anormal, tiene color amarillo o tiene mal olor. Puede ser un signo de infeccin. Tiene dolor intenso o clicos en la parte baja del abdomen que no se van con medicamentos. Se desmaya. Resumen Si se le realiz una colposcopa sin biopsia, puede esperar sentirse bien de inmediato, pero es posible que presente manchas de sangre por algunos das. Puede retomar sus actividades habituales. Si se le realiz una colposcopa con biopsia, es comn tener un dolor leve durante algunos das y manchas de sangre durante 48 horas despus del procedimiento. Evite usar productos de ducha vaginal, usar tampones y tener relaciones sexuales durante al menos 3 das despus del procedimiento o durante el tiempo que le haya indicado el mdico. Busque ayuda de inmediato si tiene sangrado profuso, dolor intenso o signos de infeccin. Esta informacin no tiene como fin reemplazar el consejo del mdico. Asegresede hacerle al mdico cualquier pregunta que tenga. Document Revised: 10/27/2019 Document Reviewed: 10/27/2019 Elsevier Patient Education  2022 Elsevier Inc.  

## 2021-03-24 LAB — SURGICAL PATHOLOGY

## 2021-10-27 ENCOUNTER — Ambulatory Visit: Payer: BC Managed Care – PPO | Admitting: Family Medicine

## 2021-10-27 ENCOUNTER — Encounter: Payer: Self-pay | Admitting: Family Medicine

## 2021-10-27 ENCOUNTER — Other Ambulatory Visit: Payer: Self-pay

## 2021-10-27 VITALS — BP 113/75 | HR 77 | Temp 98.6°F | Wt 117.4 lb

## 2021-10-27 DIAGNOSIS — J020 Streptococcal pharyngitis: Secondary | ICD-10-CM | POA: Diagnosis not present

## 2021-10-27 LAB — POCT RAPID STREP A (OFFICE): Rapid Strep A Screen: POSITIVE — AB

## 2021-10-27 MED ORDER — AMOXICILLIN 500 MG PO TABS: 500.0000 mg | ORAL_TABLET | Freq: Two times a day (BID) | ORAL | 0 refills | Status: DC

## 2021-10-27 NOTE — Progress Notes (Signed)
Subjective:  Patient ID: Carla Sims, female    DOB: 03/31/1976  Age: 46 y.o. MRN: [id number]  CC: Chief Complaint  Patient presents with   Cough    Bad cough, throat pain, pain all over body esp legs, headache. Going on for one week. At home covid negative. Pt also states that Monday at work she became pale and faint feeling, she believes her blood pressure dropped low also had leg weakness    HPI:  46 year old female presents with respiratory symptoms.  1 week history of symptoms.  Started initially with sore throat.  Sore throat persist.  Has associated cough.  Has had some body aches and headache as well.  Home COVID test negative.  No current fever.  No relieving factors.    Patient Active Problem List   Diagnosis Date Noted   Strep pharyngitis 10/27/2021   Abnormal Pap smear of cervix 02/09/2021   Cervical high risk HPV (human papillomavirus) test positive 02/09/2021    Social Hx   Social History   Socioeconomic History   Marital status: Married    Spouse name: Not on file   Number of children: Not on file   Years of education: Not on file   Highest education level: Not on file  Occupational History   Not on file  Tobacco Use   Smoking status: Never   Smokeless tobacco: Never  Vaping Use   Vaping Use: Never used  Substance and Sexual Activity   Alcohol use: No   Drug use: No   Sexual activity: Yes    Birth control/protection: Surgical    Comment: tubal  Other Topics Concern   Not on file  Social History Narrative   Not on file   Social Determinants of Health   Financial Resource Strain: Not on file  Food Insecurity: Not on file  Transportation Needs: Not on file  Physical Activity: Not on file  Stress: Not on file  Social Connections: Not on file    Review of Systems Per HPI  Objective:  BP 113/75    Pulse 77    Temp 98.6 F (37 C)    Wt 117 lb 6.4 oz (53.3 kg)    SpO2 100%    BMI 26.32 kg/m   BP/Weight 10/27/2021 03/22/2021 [id number]   Systolic BP 123456 [id number]  Diastolic BP 75 71 82  Wt. (Lbs) 117.4 118.5 117.4  BMI 26.32 26.57 26.09    Physical Exam Vitals and nursing note reviewed.  Constitutional:      General: She is not in acute distress.    Appearance: Normal appearance. She is not ill-appearing.  HENT:     Head: Normocephalic and atraumatic.     Mouth/Throat:     Pharynx: Posterior oropharyngeal erythema present.  Cardiovascular:     Rate and Rhythm: Normal rate and regular rhythm.  Pulmonary:     Effort: Pulmonary effort is normal.     Breath sounds: Normal breath sounds. No wheezing, rhonchi or rales.  Neurological:     Mental Status: She is alert.  Psychiatric:        Mood and Affect: Mood normal.        Behavior: Behavior normal.    Lab Results  Component Value Date   WBC 5.8 05/24/2011   HGB 12.4 05/24/2011   HCT 36.6 05/24/2011   PLT 249 05/24/2011   GLUCOSE 88 05/24/2011   ALT 71 (H) 05/24/2011   AST 46 (H) 05/24/2011   NA 136 05/24/2011  K 4.1 05/24/2011   CL 101 05/24/2011   CREATININE 0.51 05/24/2011   BUN 11 05/24/2011   CO2 27 05/24/2011     Assessment & Plan:   Problem List Items Addressed This Visit       Respiratory   Strep pharyngitis - Primary    Rapid strep positive today.  Treating with amoxicillin      Relevant Medications   amoxicillin (AMOXIL) 500 MG tablet   Other Relevant Orders   POCT rapid strep A (Completed)    Meds ordered this encounter  Medications   amoxicillin (AMOXIL) 500 MG tablet    Sig: Take 1 tablet (500 mg total) by mouth 2 (two) times daily.    Dispense:  20 tablet    Refill:  [place]

## 2021-10-27 NOTE — Assessment & Plan Note (Signed)
Rapid strep positive today.  Treating with amoxicillin. 

## 2021-10-27 NOTE — Patient Instructions (Signed)
Antibiotic as prescribed.  Take care  Dr. Georgette Helmer  

## 2021-12-09 ENCOUNTER — Ambulatory Visit (INDEPENDENT_AMBULATORY_CARE_PROVIDER_SITE_OTHER): Payer: BC Managed Care – PPO | Admitting: Nurse Practitioner

## 2021-12-09 ENCOUNTER — Other Ambulatory Visit: Payer: Self-pay

## 2021-12-09 VITALS — BP 130/89 | HR 88 | Temp 98.0°F | Ht <= 58 in | Wt 118.0 lb

## 2021-12-09 DIAGNOSIS — M79661 Pain in right lower leg: Secondary | ICD-10-CM

## 2021-12-09 DIAGNOSIS — I83813 Varicose veins of bilateral lower extremities with pain: Secondary | ICD-10-CM | POA: Diagnosis not present

## 2021-12-09 MED ORDER — MOMETASONE FUROATE 0.1 % EX CREA: 1.0000 "application " | TOPICAL_CREAM | Freq: Every day | CUTANEOUS | 0 refills | Status: DC

## 2021-12-09 NOTE — Progress Notes (Signed)
? ?  Subjective:  ? ? Patient ID: Carla Sims, female    DOB: 03-29-1976, 46 y.o.   MRN: [id number] ? ?HPI ?Right Leg Pain x 3 weeks - has to wear boots at work that are causing pain  ?She requests a work note , to be able to wear her own boots to be able to perform job w/o pain, taking tylenol prn not really helping  ? ?Refill cream for face rash used prn ?Review of Systems ? ?   ?Objective:  ? Physical Exam ? ? ? ? ?   ?Assessment & Plan:  ? ? ?

## 2021-12-09 NOTE — Progress Notes (Signed)
? ?  Subjective:  ? ? Patient ID: Carla Sims, female    DOB: [birth year], 46 y.o.   MRN: [id number] ? ?HPI ?Present today for right leg pain x 3 weeks - had 3/10 pain in leg prior to wearing new high knee boots for work that goes to her knees; causing worsen pain to 8/10.  Pain occurs in both legs but more on the right. Has tried tylenol prn for pain without relief. Requesting work note to be exempted from wearing new boot due to irritation of varicose veins in bilateral legs. Spanish interpreter present for entire visit per her request. ?No estrogen use. Non smoker. No recent surgery.  ? ?Review of Systems  ?Respiratory:  Negative for cough, chest tightness and shortness of breath.   ?Cardiovascular:  Negative for chest pain and leg swelling.  ?Neurological:  Negative for syncope, weakness, light-headedness and headaches.  ? ?   ?Objective:  ? Physical Exam ?Vitals and nursing note reviewed.  ?Constitutional:   ?   General: She is not in acute distress. ?   Appearance: Normal appearance.  ?Cardiovascular:  ?   Rate and Rhythm: Normal rate and regular rhythm.  ?   Comments: Multiple fine superficial varicose veins noted both lower legs. No erythema, warmth or masses noted. Both calves measure 30 cm. Feet warm with normal pulses and cap refill.  ?Pulmonary:  ?   Effort: Pulmonary effort is normal. No respiratory distress.  ?   Breath sounds: Normal breath sounds. No wheezing.  ?Musculoskeletal:     ?   General: Tenderness present.  ?   Right lower leg: No edema.  ?   Left lower leg: No edema.  ?   Comments: Generalized posterior right calf tenderness starting popliteal area. No popliteal masses. Normal ROM of the knee without tenderness. Mild tenderness left posterior calf.   ?Skin: ?   Findings: No erythema.  ?Neurological:  ?   Mental Status: She is alert and oriented to person, place, and time.  ?Psychiatric:     ?   Mood and Affect: Mood normal.     ?   Behavior: Behavior normal.     ?   Thought Content: Thought  content normal.     ?   Judgment: Judgment normal.  ? ?Vitals:  ? 12/09/21 1321  ?BP: 130/89  ?Pulse: 88  ?Temp: 98 ?F (36.7 ?C)  ?SpO2: 98%  ? ?-2 Wells score for DVT ? ?  ?Assessment & Plan:  ? ?Problem List Items Addressed This Visit   ?None ?Visit Diagnoses   ? ? Varicose veins of both lower extremities with pain    -  Primary  ? Right calf pain      ? ?  ?Work note provided to be exempted from wearing knee high boot at work.  ?Discussed symptomatic care and warning signs.  ?Recommend annual wellness exam.   ?Return if symptoms worsen or fail to improve.  ? ? ? ?

## 2021-12-10 ENCOUNTER — Encounter: Payer: Self-pay | Admitting: Nurse Practitioner

## 2022-06-26 ENCOUNTER — Other Ambulatory Visit (HOSPITAL_COMMUNITY): Payer: Self-pay | Admitting: Family Medicine

## 2022-06-26 DIAGNOSIS — Z1231 Encounter for screening mammogram for malignant neoplasm of breast: Secondary | ICD-10-CM

## 2022-07-21 ENCOUNTER — Encounter: Payer: BC Managed Care – PPO | Admitting: Family Medicine

## 2022-07-21 ENCOUNTER — Ambulatory Visit (HOSPITAL_COMMUNITY): Payer: BC Managed Care – PPO

## 2022-07-21 ENCOUNTER — Encounter: Payer: BC Managed Care – PPO | Admitting: Nurse Practitioner

## 2022-07-28 ENCOUNTER — Ambulatory Visit (HOSPITAL_COMMUNITY): Payer: BC Managed Care – PPO

## 2022-08-10 ENCOUNTER — Ambulatory Visit (HOSPITAL_COMMUNITY)
Admission: RE | Admit: 2022-08-10 | Discharge: 2022-08-10 | Disposition: A | Discharge status: Home / Self Care | Payer: BC Managed Care – PPO | Source: Ambulatory Visit | Attending: Family Medicine | Admitting: Family Medicine

## 2022-08-10 DIAGNOSIS — Z1231 Encounter for screening mammogram for malignant neoplasm of breast: Secondary | ICD-10-CM | POA: Insufficient documentation

## 2022-08-11 ENCOUNTER — Ambulatory Visit (INDEPENDENT_AMBULATORY_CARE_PROVIDER_SITE_OTHER): Payer: BC Managed Care – PPO | Admitting: Nurse Practitioner

## 2022-08-11 VITALS — BP 128/80 | HR 59 | Temp 98.8°F | Ht <= 58 in | Wt 118.0 lb

## 2022-08-11 DIAGNOSIS — Z13220 Encounter for screening for lipoid disorders: Secondary | ICD-10-CM

## 2022-08-11 DIAGNOSIS — R5383 Other fatigue: Secondary | ICD-10-CM | POA: Diagnosis not present

## 2022-08-11 DIAGNOSIS — R87612 Low grade squamous intraepithelial lesion on cytologic smear of cervix (LGSIL): Secondary | ICD-10-CM

## 2022-08-11 DIAGNOSIS — Z01419 Encounter for gynecological examination (general) (routine) without abnormal findings: Secondary | ICD-10-CM | POA: Diagnosis not present

## 2022-08-11 DIAGNOSIS — R9230 Dense breasts, unspecified: Secondary | ICD-10-CM

## 2022-08-11 DIAGNOSIS — R051 Acute cough: Secondary | ICD-10-CM | POA: Diagnosis not present

## 2022-08-11 NOTE — Progress Notes (Unsigned)
Subjective:    Patient ID: Carla Sims, female    DOB: Oct 01, 1976, 46 y.o.   MRN: [id number]  HPI Patient here for physical.  Patient started having body aches and sore throat last night, cough, no fever. Patient coughed up yellow mucus in color.  HPI: Patient presented today for annual well-woman exam accompanied by interpreter. Denies fever. Symptoms of body ache, congestion, sore throat, and cough began 2-3 days ago. Taking Nyquil and Tylenol to sleep. Reports adequate sleep but coughing at night. Working full time 12 hr shifts at [employer] in [place]. Requesting medication for symptom relief.  Has cycles every 2-3 months. Regular flow. Same sexual partner.   Review of Systems  Constitutional:  Negative for activity change, appetite change, fatigue and fever.  HENT:  Positive for congestion, postnasal drip, rhinorrhea and sore throat. Negative for sinus pressure and trouble swallowing.   Respiratory:  Negative for cough, chest tightness, shortness of breath and wheezing.   Cardiovascular:  Negative for chest pain.  Gastrointestinal:  Negative for abdominal distention, abdominal pain, constipation, diarrhea, nausea and vomiting.  Genitourinary:  Negative for difficulty urinating, dysuria, frequency, genital sores, menstrual problem, pelvic pain, urgency and vaginal discharge.  Neurological:  Negative for dizziness and headaches.  Psychiatric/Behavioral:  Negative for self-injury, sleep disturbance and suicidal ideas.       08/11/2022    2:44 PM  Depression screen PHQ 2/9  Decreased Interest 0  PHQ - 2 Score 0        08/11/2022    2:44 PM  GAD 7 : Generalized Anxiety Score  Nervous, Anxious, on Edge 0  Control/stop worrying 0  Worry too much - different things 1  Trouble relaxing 1  Restless 0  Easily annoyed or irritable 0  Afraid - awful might happen 0  Total GAD 7 Score 2      Objective:   Vitals:   08/11/22 1431  BP: 128/80  Pulse: (!) 59  Temp:  98.8 F (37.1 C)  Height: 4' 8.3" (1.43 m)  Weight: 53.5 kg  SpO2: 99%  TempSrc: Oral  BMI (Calculated): 26.17     Physical Exam Vitals and nursing note reviewed. Exam conducted with a chaperone present.  Constitutional:      General: She is not in acute distress.    Appearance: Normal appearance. She is well-developed.  HENT:     Right Ear: Tympanic membrane normal.     Left Ear: Tympanic membrane normal.     Nose: Congestion and rhinorrhea present.     Mouth/Throat:     Mouth: Mucous membranes are moist.     Pharynx: No oropharyngeal exudate or posterior oropharyngeal erythema.  Neck:     Thyroid: No thyromegaly.     Trachea: No tracheal deviation.     Comments: Thyroid non tender to palpation. No mass or goiter noted. Mild anterior cervical adenopathy.  Cardiovascular:     Rate and Rhythm: Normal rate and regular rhythm.     Heart sounds: Normal heart sounds. No murmur heard. Pulmonary:     Effort: Pulmonary effort is normal. No respiratory distress.     Breath sounds: Normal breath sounds. No wheezing.  Chest:  Breasts:    Right: No swelling, inverted nipple, mass, skin change or tenderness.     Left: No swelling, inverted nipple, mass, skin change or tenderness.     Comments: Tissue is extremely dense with fine nodularity bilaterally. No dominant masses. Abdominal:     General: There  is no distension.     Palpations: Abdomen is soft.     Tenderness: There is no abdominal tenderness.  Genitourinary:    Comments: GU exam deferred. Patient referred back to GYN for evaluation post colposcopy.  Musculoskeletal:     Cervical back: Normal range of motion and neck supple.     Right lower leg: No edema.     Left lower leg: No edema.  Lymphadenopathy:     Cervical: No cervical adenopathy.     Upper Body:     Right upper body: No supraclavicular, axillary or pectoral adenopathy.     Left upper body: No supraclavicular, axillary or pectoral adenopathy.  Skin:    General:  Skin is warm and dry.     Findings: No rash.  Neurological:     Mental Status: She is alert and oriented to person, place, and time.  Psychiatric:        Mood and Affect: Mood normal.        Behavior: Behavior normal.        Thought Content: Thought content normal.        Judgment: Judgment normal.      Tyrer-Cusick Risk Assessment Score  11.10% Assessment & Plan:   Problem List Items Addressed This Visit       Other   Abnormal Pap smear of cervix   Relevant Orders   Ambulatory referral to Gynecology   Breast density   Other Visit Diagnoses     Well woman exam    -  Primary   Acute cough       Relevant Orders   COVID-19, Flu A+B and RSV   Fatigue, unspecified type       Relevant Orders   CBC with Differential/Platelet   CMP14+EGFR   TSH   Screening, lipid       Relevant Orders   Lipid panel       Plan: Informed patient of referral back to Gynecology for recheck of previous abnormal PAP. Interpreter helped translate and understanding verbalized by patient.  Mucinex DM samples provided today in office for viral infection. Tested for flu/RSV/COVID. Warning signs reviewed. Go to ED or urgent care this weekend if worse. Call back next week if no improvement. Labs ordered.  Return in about 1 year (around 08/12/2023) for physical.

## 2022-08-11 NOTE — Progress Notes (Deleted)
   Subjective:    Patient ID: Carla Sims, female    DOB: Sep 03, 1976, 46 y.o.   MRN: [id number]  HPI  The patient comes in today for a wellness visit.    A review of their health history was completed.  A review of medications was also completed.  Any needed refills; ***  Eating habits: ***  Falls/  MVA accidents in past few months: ***  Regular exercise: ***  Specialist pt sees on regular basis: ***  Preventative health issues were discussed.   Additional concerns: ***   Review of Systems     Objective:   Physical Exam        Assessment & Plan:

## 2022-08-12 ENCOUNTER — Encounter: Payer: Self-pay | Admitting: Nurse Practitioner

## 2022-08-12 NOTE — Progress Notes (Signed)
Patient ID: Carla Sims, female   DOB: 09-Sep-1976, 46 y.o.   MRN: [id number]

## 2022-08-13 LAB — COVID-19, FLU A+B AND RSV
Influenza A, NAA: NOT DETECTED
Influenza B, NAA: NOT DETECTED
RSV, NAA: NOT DETECTED
SARS-CoV-2, NAA: NOT DETECTED

## 2022-08-13 LAB — SPECIMEN STATUS REPORT

## 2023-07-30 ENCOUNTER — Other Ambulatory Visit (HOSPITAL_COMMUNITY): Payer: Self-pay | Admitting: Family Medicine

## 2023-07-30 DIAGNOSIS — Z1231 Encounter for screening mammogram for malignant neoplasm of breast: Secondary | ICD-10-CM

## 2023-08-13 ENCOUNTER — Ambulatory Visit (HOSPITAL_COMMUNITY)
Admission: RE | Admit: 2023-08-13 | Discharge: 2023-08-13 | Disposition: A | Discharge status: Home / Self Care | Payer: BC Managed Care – PPO | Source: Ambulatory Visit | Attending: Family Medicine | Admitting: Family Medicine

## 2023-08-13 DIAGNOSIS — Z1231 Encounter for screening mammogram for malignant neoplasm of breast: Secondary | ICD-10-CM | POA: Diagnosis not present

## 2024-01-24 ENCOUNTER — Encounter: Admitting: Nurse Practitioner

## 2024-01-28 ENCOUNTER — Encounter: Payer: Self-pay | Admitting: Nurse Practitioner

## 2024-04-08 ENCOUNTER — Encounter: Admitting: Nurse Practitioner

## 2024-06-10 ENCOUNTER — Encounter: Admitting: Family Medicine

## 2024-07-14 ENCOUNTER — Ambulatory Visit: Admitting: Nurse Practitioner

## 2024-07-14 VITALS — BP 119/78 | HR 76 | Temp 98.1°F | Ht <= 58 in | Wt 118.0 lb

## 2024-07-14 DIAGNOSIS — Z01411 Encounter for gynecological examination (general) (routine) with abnormal findings: Secondary | ICD-10-CM | POA: Diagnosis not present

## 2024-07-14 DIAGNOSIS — Z1151 Encounter for screening for human papillomavirus (HPV): Secondary | ICD-10-CM

## 2024-07-14 DIAGNOSIS — Z1231 Encounter for screening mammogram for malignant neoplasm of breast: Secondary | ICD-10-CM

## 2024-07-14 DIAGNOSIS — Z01419 Encounter for gynecological examination (general) (routine) without abnormal findings: Secondary | ICD-10-CM

## 2024-07-14 DIAGNOSIS — Z1211 Encounter for screening for malignant neoplasm of colon: Secondary | ICD-10-CM

## 2024-07-14 DIAGNOSIS — Z124 Encounter for screening for malignant neoplasm of cervix: Secondary | ICD-10-CM | POA: Diagnosis not present

## 2024-07-14 DIAGNOSIS — Z23 Encounter for immunization: Secondary | ICD-10-CM | POA: Diagnosis not present

## 2024-07-14 NOTE — Progress Notes (Unsigned)
   Subjective:    Patient ID: Carla Sims, female    DOB: Aug 13, 1976, 48 y.o.   MRN: [id number]  HPI The patient comes in today for a wellness visit.    A review of their health history was completed.  A review of medications was also completed.  Any needed refills; none  Eating habits: good   Falls/  MVA accidents in past few months: no  Regular exercise: 3 times a week  Specialist pt sees on regular basis: no  Preventative health issues were discussed.   Additional concerns: discuss colonoscopy and pneum , flu vacc  Pap   Review of Systems     Objective:   Physical Exam        Assessment & Plan:

## 2024-07-17 ENCOUNTER — Other Ambulatory Visit: Payer: Self-pay | Admitting: Nurse Practitioner

## 2024-07-17 ENCOUNTER — Encounter: Payer: Self-pay | Admitting: Nurse Practitioner

## 2024-07-17 DIAGNOSIS — R5383 Other fatigue: Secondary | ICD-10-CM

## 2024-07-17 DIAGNOSIS — Z13220 Encounter for screening for lipoid disorders: Secondary | ICD-10-CM

## 2024-07-23 LAB — IGP, APTIMA HPV: HPV Aptima: NEGATIVE

## 2024-07-24 ENCOUNTER — Ambulatory Visit: Payer: Self-pay | Admitting: Nurse Practitioner

## 2024-09-10 ENCOUNTER — Encounter (INDEPENDENT_AMBULATORY_CARE_PROVIDER_SITE_OTHER): Payer: Self-pay | Admitting: *Deleted

## 2024-10-06 ENCOUNTER — Encounter (HOSPITAL_COMMUNITY): Payer: Self-pay

## 2024-10-06 ENCOUNTER — Ambulatory Visit (HOSPITAL_COMMUNITY)
Admission: RE | Admit: 2024-10-06 | Discharge: 2024-10-06 | Disposition: A | Discharge status: Home / Self Care | Source: Ambulatory Visit | Attending: Nurse Practitioner | Admitting: Nurse Practitioner

## 2024-10-06 DIAGNOSIS — Z1231 Encounter for screening mammogram for malignant neoplasm of breast: Secondary | ICD-10-CM | POA: Diagnosis present
